# Patient Record
Sex: Female | Born: 1955
Health system: Southern US, Community
[De-identification: ages and names within clinical notes are randomized; demographics above are authoritative.]

## PROBLEM LIST (undated history)

## (undated) DIAGNOSIS — Z1239 Encounter for other screening for malignant neoplasm of breast: Secondary | ICD-10-CM

## (undated) DIAGNOSIS — N63 Unspecified lump in unspecified breast: Secondary | ICD-10-CM

## (undated) DIAGNOSIS — Z860100 Personal history of colon polyps, unspecified: Secondary | ICD-10-CM

## (undated) DIAGNOSIS — Z1211 Encounter for screening for malignant neoplasm of colon: Secondary | ICD-10-CM

## (undated) DIAGNOSIS — L309 Dermatitis, unspecified: Secondary | ICD-10-CM

## (undated) DIAGNOSIS — D4860 Neoplasm of uncertain behavior of unspecified breast: Secondary | ICD-10-CM

## (undated) DIAGNOSIS — Z8371 Family history of colonic polyps: Secondary | ICD-10-CM

## (undated) DIAGNOSIS — Z83719 Family history of colon polyps, unspecified: Secondary | ICD-10-CM

## (undated) DIAGNOSIS — I10 Essential (primary) hypertension: Secondary | ICD-10-CM

## (undated) DIAGNOSIS — D649 Anemia, unspecified: Secondary | ICD-10-CM

## (undated) HISTORY — DX: Family history of colon polyps, unspecified: Z83.719

## (undated) HISTORY — PX: TOTAL ABDOMINAL HYSTERECTOMY W/ BILATERAL SALPINGOOPHORECTOMY: SHX83

## (undated) HISTORY — DX: Unspecified lump in unspecified breast: N63.0

## (undated) HISTORY — DX: Personal history of colonic polyps: Z86.010

## (undated) HISTORY — DX: Neoplasm of uncertain behavior of unspecified breast: D48.60

## (undated) HISTORY — DX: Encounter for screening for malignant neoplasm of colon: Z12.11

## (undated) HISTORY — PX: BREAST BIOPSY: SHX20

## (undated) HISTORY — DX: Encounter for other screening for malignant neoplasm of breast: Z12.39

## (undated) HISTORY — DX: Dermatitis, unspecified: L30.9

## (undated) HISTORY — PX: ABDOMINAL HYSTERECTOMY: SHX81

## (undated) HISTORY — DX: Essential (primary) hypertension: I10

## (undated) HISTORY — DX: Anemia, unspecified: D64.9

## (undated) HISTORY — DX: Personal history of colon polyps, unspecified: Z86.0100

## (undated) HISTORY — DX: Family history of colonic polyps: Z83.71

## (undated) HISTORY — PX: COLONOSCOPY: SHX174

---

## 2004-10-05 ENCOUNTER — Ambulatory Visit: Payer: Self-pay | Admitting: Family Medicine

## 2007-11-10 ENCOUNTER — Ambulatory Visit: Payer: Self-pay | Admitting: General Surgery

## 2008-08-30 ENCOUNTER — Inpatient Hospital Stay: Payer: Self-pay | Admitting: Internal Medicine

## 2009-12-01 ENCOUNTER — Ambulatory Visit: Payer: Self-pay | Admitting: Internal Medicine

## 2011-03-09 ENCOUNTER — Ambulatory Visit: Payer: Self-pay | Admitting: General Surgery

## 2011-03-12 LAB — PATHOLOGY REPORT

## 2012-05-01 HISTORY — PX: BREAST CYST ASPIRATION: SHX578

## 2012-07-21 HISTORY — PX: BREAST BIOPSY: SHX20

## 2012-11-29 ENCOUNTER — Encounter: Payer: Self-pay | Admitting: General Surgery

## 2013-01-20 ENCOUNTER — Ambulatory Visit: Payer: Self-pay | Admitting: General Surgery

## 2013-01-28 ENCOUNTER — Encounter: Payer: Self-pay | Admitting: *Deleted

## 2013-07-01 ENCOUNTER — Ambulatory Visit: Payer: Self-pay | Admitting: Emergency Medicine

## 2013-07-01 LAB — URINALYSIS, COMPLETE
Blood: NEGATIVE
Ketone: NEGATIVE
RBC,UR: NONE SEEN /HPF (ref 0–5)
Specific Gravity: 1.01 (ref 1.003–1.030)

## 2013-10-01 DIAGNOSIS — K863 Pseudocyst of pancreas: Secondary | ICD-10-CM | POA: Insufficient documentation

## 2014-02-12 DIAGNOSIS — K219 Gastro-esophageal reflux disease without esophagitis: Secondary | ICD-10-CM | POA: Insufficient documentation

## 2014-02-12 DIAGNOSIS — I10 Essential (primary) hypertension: Secondary | ICD-10-CM | POA: Insufficient documentation

## 2014-02-14 DIAGNOSIS — E559 Vitamin D deficiency, unspecified: Secondary | ICD-10-CM | POA: Insufficient documentation

## 2014-02-14 DIAGNOSIS — D649 Anemia, unspecified: Secondary | ICD-10-CM | POA: Insufficient documentation

## 2014-02-17 ENCOUNTER — Ambulatory Visit: Payer: Self-pay | Admitting: Family Medicine

## 2014-08-02 ENCOUNTER — Encounter: Payer: Self-pay | Admitting: General Surgery

## 2014-12-15 DIAGNOSIS — M5412 Radiculopathy, cervical region: Secondary | ICD-10-CM | POA: Insufficient documentation

## 2015-12-22 ENCOUNTER — Encounter: Payer: Self-pay | Admitting: *Deleted

## 2016-02-20 ENCOUNTER — Encounter: Payer: Self-pay | Admitting: *Deleted

## 2016-03-07 ENCOUNTER — Ambulatory Visit: Payer: Self-pay | Admitting: General Surgery

## 2016-04-15 DIAGNOSIS — R9431 Abnormal electrocardiogram [ECG] [EKG]: Secondary | ICD-10-CM | POA: Diagnosis not present

## 2016-04-15 DIAGNOSIS — I517 Cardiomegaly: Secondary | ICD-10-CM | POA: Diagnosis not present

## 2016-04-15 DIAGNOSIS — R109 Unspecified abdominal pain: Secondary | ICD-10-CM | POA: Diagnosis not present

## 2016-04-15 DIAGNOSIS — R51 Headache: Secondary | ICD-10-CM | POA: Diagnosis not present

## 2016-04-15 DIAGNOSIS — G8929 Other chronic pain: Secondary | ICD-10-CM | POA: Diagnosis not present

## 2016-04-15 DIAGNOSIS — R8271 Bacteriuria: Secondary | ICD-10-CM | POA: Diagnosis not present

## 2016-04-15 DIAGNOSIS — I10 Essential (primary) hypertension: Secondary | ICD-10-CM | POA: Diagnosis not present

## 2016-04-15 DIAGNOSIS — R1084 Generalized abdominal pain: Secondary | ICD-10-CM | POA: Diagnosis not present

## 2016-04-20 DIAGNOSIS — D500 Iron deficiency anemia secondary to blood loss (chronic): Secondary | ICD-10-CM | POA: Diagnosis not present

## 2016-04-20 DIAGNOSIS — I10 Essential (primary) hypertension: Secondary | ICD-10-CM | POA: Diagnosis not present

## 2016-05-03 DIAGNOSIS — I10 Essential (primary) hypertension: Secondary | ICD-10-CM | POA: Diagnosis not present

## 2016-05-03 DIAGNOSIS — D500 Iron deficiency anemia secondary to blood loss (chronic): Secondary | ICD-10-CM | POA: Diagnosis not present

## 2016-06-27 ENCOUNTER — Encounter: Payer: Self-pay | Admitting: *Deleted

## 2016-08-09 DIAGNOSIS — Z1211 Encounter for screening for malignant neoplasm of colon: Secondary | ICD-10-CM | POA: Diagnosis not present

## 2016-08-09 DIAGNOSIS — D500 Iron deficiency anemia secondary to blood loss (chronic): Secondary | ICD-10-CM | POA: Diagnosis not present

## 2016-08-09 DIAGNOSIS — J040 Acute laryngitis: Secondary | ICD-10-CM | POA: Diagnosis not present

## 2016-08-09 DIAGNOSIS — J069 Acute upper respiratory infection, unspecified: Secondary | ICD-10-CM | POA: Diagnosis not present

## 2016-08-21 ENCOUNTER — Encounter: Payer: Self-pay | Admitting: *Deleted

## 2016-08-27 ENCOUNTER — Ambulatory Visit: Payer: Self-pay | Admitting: General Surgery

## 2016-10-05 ENCOUNTER — Encounter: Payer: Self-pay | Admitting: General Surgery

## 2016-10-07 DIAGNOSIS — J069 Acute upper respiratory infection, unspecified: Secondary | ICD-10-CM | POA: Diagnosis not present

## 2016-10-07 DIAGNOSIS — J029 Acute pharyngitis, unspecified: Secondary | ICD-10-CM | POA: Diagnosis not present

## 2016-10-07 DIAGNOSIS — B9789 Other viral agents as the cause of diseases classified elsewhere: Secondary | ICD-10-CM | POA: Diagnosis not present

## 2016-10-24 DIAGNOSIS — R0789 Other chest pain: Secondary | ICD-10-CM | POA: Diagnosis not present

## 2016-11-14 DIAGNOSIS — H5203 Hypermetropia, bilateral: Secondary | ICD-10-CM | POA: Diagnosis not present

## 2017-04-08 DIAGNOSIS — Z800 Family history of malignant neoplasm of digestive organs: Secondary | ICD-10-CM | POA: Insufficient documentation

## 2017-04-08 DIAGNOSIS — Z86010 Personal history of colonic polyps: Secondary | ICD-10-CM | POA: Insufficient documentation

## 2017-04-09 DIAGNOSIS — K219 Gastro-esophageal reflux disease without esophagitis: Secondary | ICD-10-CM | POA: Diagnosis not present

## 2017-04-09 DIAGNOSIS — Z1211 Encounter for screening for malignant neoplasm of colon: Secondary | ICD-10-CM | POA: Diagnosis not present

## 2017-04-09 DIAGNOSIS — Z86010 Personal history of colonic polyps: Secondary | ICD-10-CM | POA: Diagnosis not present

## 2017-04-09 DIAGNOSIS — K644 Residual hemorrhoidal skin tags: Secondary | ICD-10-CM | POA: Diagnosis not present

## 2017-04-09 DIAGNOSIS — K6389 Other specified diseases of intestine: Secondary | ICD-10-CM | POA: Diagnosis not present

## 2017-04-09 DIAGNOSIS — I10 Essential (primary) hypertension: Secondary | ICD-10-CM | POA: Diagnosis not present

## 2017-04-09 DIAGNOSIS — D120 Benign neoplasm of cecum: Secondary | ICD-10-CM | POA: Diagnosis not present

## 2017-04-09 DIAGNOSIS — Z800 Family history of malignant neoplasm of digestive organs: Secondary | ICD-10-CM | POA: Diagnosis not present

## 2017-04-09 DIAGNOSIS — D500 Iron deficiency anemia secondary to blood loss (chronic): Secondary | ICD-10-CM | POA: Diagnosis not present

## 2017-04-09 DIAGNOSIS — K859 Acute pancreatitis without necrosis or infection, unspecified: Secondary | ICD-10-CM | POA: Diagnosis not present

## 2017-04-09 DIAGNOSIS — D649 Anemia, unspecified: Secondary | ICD-10-CM | POA: Diagnosis not present

## 2017-04-09 DIAGNOSIS — K5730 Diverticulosis of large intestine without perforation or abscess without bleeding: Secondary | ICD-10-CM | POA: Diagnosis not present

## 2017-09-03 DIAGNOSIS — D500 Iron deficiency anemia secondary to blood loss (chronic): Secondary | ICD-10-CM | POA: Diagnosis not present

## 2017-09-03 DIAGNOSIS — M7061 Trochanteric bursitis, right hip: Secondary | ICD-10-CM | POA: Diagnosis not present

## 2017-09-03 DIAGNOSIS — M940 Chondrocostal junction syndrome [Tietze]: Secondary | ICD-10-CM | POA: Diagnosis not present

## 2017-09-03 DIAGNOSIS — R7989 Other specified abnormal findings of blood chemistry: Secondary | ICD-10-CM | POA: Diagnosis not present

## 2017-09-03 DIAGNOSIS — I10 Essential (primary) hypertension: Secondary | ICD-10-CM | POA: Diagnosis not present

## 2017-09-04 ENCOUNTER — Encounter: Payer: Self-pay | Admitting: Emergency Medicine

## 2017-09-04 ENCOUNTER — Emergency Department
Admission: EM | Admit: 2017-09-04 | Discharge: 2017-09-04 | Disposition: A | Discharge status: Home / Self Care | Payer: BLUE CROSS/BLUE SHIELD | Attending: Emergency Medicine | Admitting: Emergency Medicine

## 2017-09-04 ENCOUNTER — Other Ambulatory Visit: Payer: Self-pay

## 2017-09-04 ENCOUNTER — Emergency Department: Payer: BLUE CROSS/BLUE SHIELD

## 2017-09-04 DIAGNOSIS — J069 Acute upper respiratory infection, unspecified: Secondary | ICD-10-CM | POA: Diagnosis not present

## 2017-09-04 DIAGNOSIS — I10 Essential (primary) hypertension: Secondary | ICD-10-CM | POA: Insufficient documentation

## 2017-09-04 DIAGNOSIS — M25511 Pain in right shoulder: Secondary | ICD-10-CM | POA: Diagnosis not present

## 2017-09-04 DIAGNOSIS — R079 Chest pain, unspecified: Secondary | ICD-10-CM | POA: Diagnosis not present

## 2017-09-04 DIAGNOSIS — R05 Cough: Secondary | ICD-10-CM | POA: Diagnosis not present

## 2017-09-04 DIAGNOSIS — S299XXA Unspecified injury of thorax, initial encounter: Secondary | ICD-10-CM | POA: Diagnosis not present

## 2017-09-04 DIAGNOSIS — Z79899 Other long term (current) drug therapy: Secondary | ICD-10-CM | POA: Insufficient documentation

## 2017-09-04 MED ORDER — AZITHROMYCIN 250 MG PO TABS: ORAL_TABLET | ORAL | 0 refills | Status: DC

## 2017-09-04 NOTE — ED Triage Notes (Signed)
Says sick since around thanksgiving with cough, body aches.  Says she fell 2 days ago walking on wet pavement.  inj right sholder and under right breast.

## 2017-09-04 NOTE — Discharge Instructions (Signed)
Follow up with your regular doctor if you are not better in 3-5 days, take the medication as prescribed, he can also use over-the-counter Mucinex or Delsym, if you become short of breath or feel that you're becoming worse please return to the emergency department

## 2017-09-04 NOTE — ED Provider Notes (Signed)
[facility] Emergency Department Provider Note  ____________________________________________   First MD Initiated Contact with Patient 09/04/17 1059     (approximate)  I have reviewed the triage vital signs and the nursing notes.   HISTORY  Chief Complaint Fall and Cough    HPI Laurie Gross is a 61 y.o. female complains of cough and congestion since before Thanksgiving, she denies fever or chills, states mucus is yellow and green, she also fell 2 days ago and is having pain in the right shoulder and under her breast,not as chest pain or shortness of breath, denies abdominal pain   Past Medical History:  Diagnosis Date  . Anemia   . Breast screening, unspecified   . Family history of colonic polyps   . Lump or mass in breast   . Neoplasm of uncertain behavior of breast   . Personal history of colonic polyps   . Special screening for malignant neoplasms, colon   . Unspecified essential hypertension     There are no active problems to display for this patient.   Past Surgical History:  Procedure Laterality Date  . BREAST BIOPSY Right Age 79  . BREAST BIOPSY Left 07-21-12   Core biopsy showed atypical lobular hyperplasia and a 2 mm foci, atypical ductal hyperplasia and a 1 mm foci. There was no evidence of in situ or invasive cancer  . BREAST CYST ASPIRATION  August 2013   inconclusive, with atypical ductal cells identified  . COLONOSCOPY  2002, 2005, 2009, 2012   polyps   . TOTAL ABDOMINAL HYSTERECTOMY W/ BILATERAL SALPINGOOPHORECTOMY  Age 73    Prior to Admission medications   Medication Sig Start Date End Date Taking? Authorizing Provider  azithromycin (ZITHROMAX Z-PAK) 250 MG tablet 2 pills today then 1 pill a day for 4 days 09/04/17   Versie Starks, PA  carvedilol (COREG) 6.25 MG tablet Take 6.25 mg by mouth daily.    [provider]  lisinopril-hydrochlorothiazide (PRINZIDE,ZESTORETIC) 10-12.5 MG per tablet Take 1 tablet by  mouth daily.    [provider]  tamoxifen (NOLVADEX) 20 MG tablet Take 20 mg by mouth daily.    [provider]    Allergies Penicillins  Family History  Problem Relation Age of Onset  . Colon cancer Father   . Colon cancer Other        Paternal first cousin    Social History Social History   Tobacco Use  . Smoking status: Never Smoker  . Smokeless tobacco: Never Used  Substance Use Topics  . Alcohol use: Yes    Comment: occassionally  . Drug use: No    Review of Systems  Constitutional: No fever/chills Eyes: No visual changes. ENT: No sore throat. Positive runny nose and congestion Respiratory: Positive cough Genitourinary: Negative for dysuria. Musculoskeletal: Negative for back pain. Positive for right rib pain, positive right shoulder pain Skin: Negative for rash.    ____________________________________________   PHYSICAL EXAM:  VITAL SIGNS: ED Triage Vitals  Enc Vitals Group     BP 09/04/17 1010 (!) 181/87     Pulse Rate 09/04/17 1010 86     Resp 09/04/17 1010 16     Temp 09/04/17 1010 98.4 F (36.9 C)     Temp Source 09/04/17 1010 Oral     SpO2 09/04/17 1010 100 %     Weight 09/04/17 1011 158 lb (71.7 kg)     Height 09/04/17 1011 5\' 7"  (1.702 m)  Head Circumference --      Peak Flow --      Pain Score 09/04/17 1010 5     Pain Loc --      Pain Edu? --      Excl. in Reserve? --     Constitutional: Alert and oriented. Well appearing and in no acute distress. Eyes: Conjunctivae are normal.  Head: Atraumatic. Nose: No congestion/rhinnorhea. Mouth/Throat: Mucous membranes are moist.  No redness in the throat Cardiovascular: Normal rate, regular rhythm. Heart sounds are normal Respiratory: Normal respiratory effort.  No retractions, lungs are clear to auscultation, cough is dry and hacking GU: deferred Musculoskeletal: FROM all extremities, warm and well perfused, right ribs are tender no fractures palpated Neurologic:  Normal  speech and language.  Skin:  Skin is warm, dry and intact. No rash noted. Psychiatric: Mood and affect are normal. Speech and behavior are normal.  ____________________________________________   LABS (all labs ordered are listed, but only abnormal results are displayed)  Labs Reviewed - No data to display ____________________________________________   ____________________________________________  RADIOLOGY  Chest x-ray is normal  ____________________________________________   PROCEDURES  Procedure(s) performed: No      ____________________________________________   INITIAL IMPRESSION / ASSESSMENT AND PLAN / ED COURSE  Pertinent labs & imaging results that were available during my care of the patient were reviewed by me and considered in my medical decision making (see chart for details).  Patient is  a 61 year old female with cough and congestion for about a month, prescription for a Z-Pak was given, chest x-ray was negative, patient appears well and in no distress. Is discharged in stable condition, she was instructed to go to her family doctor if she is worsening or return to the emergency department    ____________________________________________   FINAL CLINICAL IMPRESSION(S) / ED DIAGNOSES  Final diagnoses:  Upper respiratory tract infection, unspecified type      NEW MEDICATIONS STARTED DURING THIS VISIT:  This SmartLink is deprecated. Use AVSMEDLIST instead to display the medication list for a patient.   Note:  This document was prepared using Dragon voice recognition software and may include unintentional dictation errors.    Versie Starks, PA 09/04/17 1423    Nance Pear, MD 09/04/17 [phone]

## 2017-09-12 DIAGNOSIS — E782 Mixed hyperlipidemia: Secondary | ICD-10-CM | POA: Diagnosis not present

## 2017-09-12 DIAGNOSIS — I10 Essential (primary) hypertension: Secondary | ICD-10-CM | POA: Diagnosis not present

## 2017-09-12 DIAGNOSIS — M940 Chondrocostal junction syndrome [Tietze]: Secondary | ICD-10-CM | POA: Diagnosis not present

## 2017-09-12 DIAGNOSIS — Z23 Encounter for immunization: Secondary | ICD-10-CM | POA: Diagnosis not present

## 2017-09-12 DIAGNOSIS — R7989 Other specified abnormal findings of blood chemistry: Secondary | ICD-10-CM | POA: Diagnosis not present

## 2017-09-12 DIAGNOSIS — E611 Iron deficiency: Secondary | ICD-10-CM | POA: Diagnosis not present

## 2017-09-13 DIAGNOSIS — R7989 Other specified abnormal findings of blood chemistry: Secondary | ICD-10-CM | POA: Diagnosis not present

## 2017-09-13 DIAGNOSIS — E782 Mixed hyperlipidemia: Secondary | ICD-10-CM | POA: Diagnosis not present

## 2017-09-13 DIAGNOSIS — I10 Essential (primary) hypertension: Secondary | ICD-10-CM | POA: Diagnosis not present

## 2017-09-13 DIAGNOSIS — E611 Iron deficiency: Secondary | ICD-10-CM | POA: Diagnosis not present

## 2017-10-03 ENCOUNTER — Inpatient Hospital Stay: Payer: BLUE CROSS/BLUE SHIELD | Admitting: Oncology

## 2017-11-06 ENCOUNTER — Encounter: Payer: Self-pay | Admitting: Internal Medicine

## 2017-11-06 ENCOUNTER — Ambulatory Visit: Payer: BLUE CROSS/BLUE SHIELD | Admitting: Internal Medicine

## 2017-11-06 VITALS — BP 136/61 | HR 94 | Resp 16 | Ht 67.0 in | Wt 155.8 lb

## 2017-11-06 DIAGNOSIS — E782 Mixed hyperlipidemia: Secondary | ICD-10-CM

## 2017-11-06 DIAGNOSIS — R300 Dysuria: Secondary | ICD-10-CM

## 2017-11-06 DIAGNOSIS — Z0001 Encounter for general adult medical examination with abnormal findings: Secondary | ICD-10-CM | POA: Diagnosis not present

## 2017-11-06 DIAGNOSIS — K2100 Gastro-esophageal reflux disease with esophagitis, without bleeding: Secondary | ICD-10-CM

## 2017-11-06 DIAGNOSIS — I10 Essential (primary) hypertension: Secondary | ICD-10-CM

## 2017-11-06 DIAGNOSIS — D508 Other iron deficiency anemias: Secondary | ICD-10-CM

## 2017-11-06 MED ORDER — LISINOPRIL 40 MG PO TABS: 40.0000 mg | ORAL_TABLET | Freq: Every day | ORAL | 11 refills | Status: DC

## 2017-11-06 MED ORDER — NIACIN ER (ANTIHYPERLIPIDEMIC) 500 MG PO TBCR: 500.0000 mg | EXTENDED_RELEASE_TABLET | Freq: Every day | ORAL | 11 refills | Status: DC

## 2017-11-06 MED ORDER — AMLODIPINE BESYLATE 10 MG PO TABS: 10.0000 mg | ORAL_TABLET | Freq: Every day | ORAL | 10 refills | Status: DC

## 2017-11-06 MED ORDER — PANTOPRAZOLE SODIUM 40 MG PO TBEC: 40.0000 mg | DELAYED_RELEASE_TABLET | Freq: Every day | ORAL | 12 refills | Status: DC

## 2017-11-06 NOTE — Progress Notes (Signed)
[facility] [place], [place] [address]  Internal MEDICINE  Office Visit Note  Patient Name: Laurie Gross  [id number]  [id number]  Date of Service: 11/26/2017  Chief Complaint  Patient presents with  . Annual Exam  . Anemia     HPI Pt is here for routine health maintenance examination. She has been feeling well. She has been taking iron for anemia but does feel tired at times. Had Colonoscopy with polyps Takes all medications as prescribed at this time   Current Medication: Outpatient Encounter Medications as of 11/06/2017  Medication Sig  . Cholecalciferol (VITAMIN D-3) 5000 units TABS Take 5,000 Units by mouth.  . docusate sodium (COLACE) 100 MG capsule Take 100 mg by mouth 2 (two) times daily.  Marland Kitchen lisinopril (PRINIVIL,ZESTRIL) 40 MG tablet Take 1 tablet (40 mg total) by mouth daily.  . niacin (NIASPAN) 500 MG CR tablet Take 1 tablet (500 mg total) by mouth at bedtime.  . Saccharomyces boulardii (FLORASTOR PO) Take by mouth.  . [DISCONTINUED] lisinopril (PRINIVIL,ZESTRIL) 40 MG tablet Take by mouth.  . [DISCONTINUED] niacin (NIASPAN) 500 MG CR tablet Take by mouth.  Marland Kitchen amLODipine (NORVASC) 10 MG tablet Take 1 tablet (10 mg total) by mouth daily.  . pantoprazole (PROTONIX) 40 MG tablet Take 1 tablet (40 mg total) by mouth daily.  . [DISCONTINUED] amLODipine (NORVASC) 10 MG tablet   . [DISCONTINUED] azithromycin (ZITHROMAX Z-PAK) 250 MG tablet 2 pills today then 1 pill a day for 4 days  . [DISCONTINUED] carvedilol (COREG) 6.25 MG tablet Take 6.25 mg by mouth daily.  . [DISCONTINUED] lisinopril-hydrochlorothiazide (PRINZIDE,ZESTORETIC) 10-12.5 MG per tablet Take 1 tablet by mouth daily.  . [DISCONTINUED] pantoprazole (PROTONIX) 40 MG tablet TK 1 T PO BID  . [DISCONTINUED] tamoxifen (NOLVADEX) 20 MG tablet Take 20 mg by mouth daily.   No facility-administered encounter medications on file as of 11/06/2017.     Surgical History: Past Surgical History:   Procedure Laterality Date  . BREAST BIOPSY Right Age 66  . BREAST BIOPSY Left 07-21-12   Core biopsy showed atypical lobular hyperplasia and a 2 mm foci, atypical ductal hyperplasia and a 1 mm foci. There was no evidence of in situ or invasive cancer  . BREAST CYST ASPIRATION  August 2013   inconclusive, with atypical ductal cells identified  . COLONOSCOPY  2002, 2005, 2009, 2012   polyps   . TOTAL ABDOMINAL HYSTERECTOMY W/ BILATERAL SALPINGOOPHORECTOMY  Age 27    Medical History: Past Medical History:  Diagnosis Date  . Anemia   . Breast screening, unspecified   . Family history of colonic polyps   . Lump or mass in breast   . Neoplasm of uncertain behavior of breast   . Personal history of colonic polyps   . Special screening for malignant neoplasms, colon   . Unspecified essential hypertension     Family History: Family History  Problem Relation Age of Onset  . Colon cancer Father   . Colon cancer Other        Paternal first cousin      Review of Systems  Constitutional: Negative for activity change, appetite change, chills, diaphoresis, fatigue, fever and unexpected weight change.  HENT: Negative for congestion, ear discharge, ear pain, facial swelling, hearing loss, nosebleeds, postnasal drip, rhinorrhea, sinus pressure, sinus pain, sneezing, sore throat, tinnitus, trouble swallowing and voice change.   Eyes: Negative for photophobia, pain, discharge, redness, itching and visual disturbance.  Respiratory: Negative for apnea, cough, choking,  chest tightness, shortness of breath, wheezing and stridor.   Cardiovascular: Positive for palpitations. Negative for chest pain and leg swelling.  Gastrointestinal: Negative for abdominal distention, abdominal pain, anal bleeding, constipation, diarrhea, nausea, rectal pain and vomiting.  Endocrine: Negative for cold intolerance, heat intolerance, polydipsia, polyphagia and polyuria.  Genitourinary: Negative for difficulty  urinating, dysuria, flank pain, frequency, genital sores, hematuria, menstrual problem, pelvic pain, urgency, vaginal bleeding, vaginal discharge and vaginal pain.  Musculoskeletal: Negative for arthralgias, back pain, gait problem, joint swelling, myalgias and neck pain.  Skin: Negative for color change, pallor, rash and wound.  Allergic/Immunologic: Negative for environmental allergies, food allergies and immunocompromised state.  Neurological: Negative for dizziness, seizures, syncope, facial asymmetry, speech difficulty, weakness, light-headedness, numbness and headaches.  Hematological: Negative for adenopathy. Does not bruise/bleed easily.  Psychiatric/Behavioral: Negative for agitation, behavioral problems, confusion, decreased concentration, dysphoric mood, hallucinations, self-injury, sleep disturbance and suicidal ideas. The patient is not nervous/anxious and is not hyperactive.      Vital Signs: BP 136/61 (BP Location: Left Arm, Patient Position: Sitting)   Pulse 94   Resp 16   Ht 5\' 7"  (1.702 m)   Wt 155 lb 12.8 oz (70.7 kg)   SpO2 100%   BMI 24.40 kg/m    Physical Exam  Constitutional: She appears well-developed and well-nourished. No distress.  HENT:  Head: Normocephalic and atraumatic.  Right Ear: External ear normal.  Left Ear: External ear normal.  Nose: Nose normal.  Mouth/Throat: Oropharynx is clear and moist. No oropharyngeal exudate.  Eyes: Conjunctivae and EOM are normal. Pupils are equal, round, and reactive to light. Right eye exhibits no discharge. Left eye exhibits no discharge. No scleral icterus.  Neck: Normal range of motion. Neck supple. No JVD present. No tracheal deviation present. No thyromegaly present.  Cardiovascular: Normal rate, regular rhythm, normal heart sounds and intact distal pulses. Exam reveals no gallop and no friction rub.  No murmur heard. Pulmonary/Chest: Effort normal and breath sounds normal. No stridor. No respiratory distress. She  has no wheezes. She has no rales. She exhibits no tenderness. Right breast exhibits no mass. Left breast exhibits no mass. Breasts are symmetrical.  Abdominal: Soft. Bowel sounds are normal. She exhibits no distension and no mass. There is no tenderness. There is no rebound and no guarding.  Genitourinary: Vagina normal and uterus normal. No vaginal discharge found.  Musculoskeletal: Normal range of motion. She exhibits no edema, tenderness or deformity.  Lymphadenopathy:    She has no cervical adenopathy.  Neurological: She is alert. She has normal reflexes. No cranial nerve deficit. She exhibits normal muscle tone. Coordination normal.  Skin: Skin is warm and dry. No rash noted. She is not diaphoretic. No erythema. No pallor.  Psychiatric: She has a normal mood and affect. Her behavior is normal. Judgment and thought content normal.     LABS: Recent Results (from the past 2160 hour(s))  Urinalysis, Routine w reflex microscopic     Status: None   Collection Time: 11/06/17 11:42 AM  Result Value Ref Range   Specific Gravity, UA 1.016 1.005 - 1.030   pH, UA 5.5 5.0 - 7.5   Color, UA Yellow Yellow   Appearance Ur Clear Clear   Leukocytes, UA Negative Negative   Protein, UA Negative Negative/Trace   Glucose, UA Negative Negative   Ketones, UA Negative Negative   RBC, UA Negative Negative   Bilirubin, UA Negative Negative   Urobilinogen, Ur 0.2 0.2 - 1.0 mg/dL   Nitrite, UA Negative  Negative   Microscopic Examination Comment     Comment: Microscopic not indicated and not performed.  CBC with Differential/Platelet     Status: Abnormal   Collection Time: 11/06/17  1:33 PM  Result Value Ref Range   WBC 4.2 3.4 - 10.8 x10E3/uL   RBC 3.35 (L) 3.77 - 5.28 x10E6/uL   Hemoglobin 8.9 (L) 11.1 - 15.9 g/dL   Hematocrit 28.2 (L) 34.0 - 46.6 %   MCV 84 79 - 97 fL   MCH 26.6 26.6 - 33.0 pg   MCHC 31.6 31.5 - 35.7 g/dL   RDW 17.8 (H) 12.3 - 15.4 %   Platelets 314 150 - 379 x10E3/uL    Neutrophils 58 Not Estab. %   Lymphs 30 Not Estab. %   Monocytes 8 Not Estab. %   Eos 3 Not Estab. %   Basos 1 Not Estab. %   Neutrophils Absolute 2.4 1.4 - 7.0 x10E3/uL   Lymphocytes Absolute 1.3 0.7 - 3.1 x10E3/uL   Monocytes Absolute 0.4 0.1 - 0.9 x10E3/uL   EOS (ABSOLUTE) 0.1 0.0 - 0.4 x10E3/uL   Basophils Absolute 0.0 0.0 - 0.2 x10E3/uL   Immature Granulocytes 0 Not Estab. %   Immature Grans (Abs) 0.0 0.0 - 0.1 x10E3/uL  Lipid Panel With LDL/HDL Ratio     Status: Abnormal   Collection Time: 11/06/17  1:33 PM  Result Value Ref Range   Cholesterol, Total 228 (H) 100 - 199 mg/dL   Triglycerides 284 (H) 0 - 149 mg/dL   HDL 79 >39 mg/dL   VLDL Cholesterol Cal 57 (H) 5 - 40 mg/dL   LDL Calculated 92 0 - 99 mg/dL   LDl/HDL Ratio 1.2 0.0 - 3.2 ratio    Comment:                                     LDL/HDL Ratio                                             Men  Women                               1/2 Avg.Risk  1.0    1.5                                   Avg.Risk  3.6    3.2                                2X Avg.Risk  6.2    5.0                                3X Avg.Risk  8.0    6.1   TSH     Status: None   Collection Time: 11/06/17  1:33 PM  Result Value Ref Range   TSH 0.707 0.450 - 4.500 uIU/mL  T4, free     Status: None   Collection Time: 11/06/17  1:33 PM  Result Value Ref Range   Free T4 1.24 0.82 - 1.77  ng/dL  Comprehensive metabolic panel     Status: Abnormal   Collection Time: 11/06/17  1:33 PM  Result Value Ref Range   Glucose 93 65 - 99 mg/dL   BUN 9 8 - 27 mg/dL   Creatinine, Ser 0.53 (L) 0.57 - 1.00 mg/dL   GFR calc non Af Amer 103 >59 mL/min/1.73   GFR calc Af Amer 119 >59 mL/min/1.73   BUN/Creatinine Ratio 17 12 - 28   Sodium 144 134 - 144 mmol/L   Potassium 3.6 3.5 - 5.2 mmol/L   Chloride 101 96 - 106 mmol/L   CO2 26 20 - 29 mmol/L   Calcium 9.6 8.7 - 10.3 mg/dL   Total Protein 7.2 6.0 - 8.5 g/dL   Albumin 4.6 3.6 - 4.8 g/dL   Globulin, Total 2.6 1.5 -  4.5 g/dL   Albumin/Globulin Ratio 1.8 1.2 - 2.2   Bilirubin Total 0.7 0.0 - 1.2 mg/dL   Alkaline Phosphatase 46 39 - 117 IU/L   AST 21 0 - 40 IU/L   ALT 11 0 - 32 IU/L  Fe+TIBC+Fer     Status: Abnormal   Collection Time: 11/06/17  1:33 PM  Result Value Ref Range   Total Iron Binding Capacity 268 250 - 450 ug/dL   UIBC 173 118 - 369 ug/dL   Iron 95 27 - 139 ug/dL   Iron Saturation 35 15 - 55 %   Ferritin 610 (H) 15 - 150 ng/mL   Assessment/Plan: 1. Encounter for general adult medical examination with abnormal findings - Update all medications  - TSH - T4, free - Comprehensive metabolic panel - MM DIGITAL SCREENING BILATERAL; Future  2. Iron deficiency anemia secondary to inadequate dietary iron intake - Continue as before - CBC with Differential/Platelet - Fe+TIBC+Fer  3. Mixed hyperlipidemia - niacin (NIASPAN) 500 MG CR tablet; Take 1 tablet (500 mg total) by mouth at bedtime.  Dispense: 30 tablet; Refill: 11 - Lipid Panel With LDL/HDL Ratio  4. Essential hypertension, benign  - lisinopril (PRINIVIL,ZESTRIL) 40 MG tablet; Take 1 tablet (40 mg total) by mouth daily.  Dispense: 30 tablet; Refill: 11 - amLODipine (NORVASC) 10 MG tablet; Take 1 tablet (10 mg total) by mouth daily.  Dispense: 30 tablet; Refill: 10 - Comprehensive metabolic panel  5. Dysuria - Urinalysis, Routine w reflex microscopic - Urinalysis  6. Gastroesophageal reflux disease with esophagitis - pantoprazole (PROTONIX) 40 MG tablet; Take 1 tablet (40 mg total) by mouth daily.  Dispense: 30 tablet; Refill: 12  General Counseling: Avayah verbalizes understanding of the findings of todays visit and agrees with plan of treatment. I have discussed any further diagnostic evaluation that may be needed or ordered today. We also reviewed her medications today. she has been encouraged to call the office with any questions or concerns that should arise related to todays visit.   Orders Placed This Encounter   Procedures  . MM DIGITAL SCREENING BILATERAL  . Urinalysis, Routine w reflex microscopic  . CBC with Differential/Platelet  . Lipid Panel With LDL/HDL Ratio  . TSH  . T4, free  . Comprehensive metabolic panel  . Urinalysis  . Fe+TIBC+Fer    Meds ordered this encounter  Medications  . pantoprazole (PROTONIX) 40 MG tablet    Sig: Take 1 tablet (40 mg total) by mouth daily.    Dispense:  30 tablet    Refill:  12  . niacin (NIASPAN) 500 MG CR tablet    Sig: Take 1 tablet (500 mg total)  by mouth at bedtime.    Dispense:  30 tablet    Refill:  11  . lisinopril (PRINIVIL,ZESTRIL) 40 MG tablet    Sig: Take 1 tablet (40 mg total) by mouth daily.    Dispense:  30 tablet    Refill:  11  . amLODipine (NORVASC) 10 MG tablet    Sig: Take 1 tablet (10 mg total) by mouth daily.    Dispense:  30 tablet    Refill:  10    Time spent:30 Benzie, MD  Internal Medicine

## 2017-11-07 LAB — CBC WITH DIFFERENTIAL/PLATELET
BASOS: 1 %
Basophils Absolute: 0 10*3/uL (ref 0.0–0.2)
EOS (ABSOLUTE): 0.1 10*3/uL (ref 0.0–0.4)
EOS: 3 %
HEMATOCRIT: 28.2 % — AB (ref 34.0–46.6)
HEMOGLOBIN: 8.9 g/dL — AB (ref 11.1–15.9)
IMMATURE GRANS (ABS): 0 10*3/uL (ref 0.0–0.1)
Immature Granulocytes: 0 %
LYMPHS: 30 %
Lymphocytes Absolute: 1.3 10*3/uL (ref 0.7–3.1)
MCH: 26.6 pg (ref 26.6–33.0)
MCHC: 31.6 g/dL (ref 31.5–35.7)
MCV: 84 fL (ref 79–97)
MONOCYTES: 8 %
Monocytes Absolute: 0.4 10*3/uL (ref 0.1–0.9)
NEUTROS ABS: 2.4 10*3/uL (ref 1.4–7.0)
Neutrophils: 58 %
Platelets: 314 10*3/uL (ref 150–379)
RBC: 3.35 x10E6/uL — ABNORMAL LOW (ref 3.77–5.28)
RDW: 17.8 % — ABNORMAL HIGH (ref 12.3–15.4)
WBC: 4.2 10*3/uL (ref 3.4–10.8)

## 2017-11-07 LAB — URINALYSIS, ROUTINE W REFLEX MICROSCOPIC
Bilirubin, UA: NEGATIVE
Glucose, UA: NEGATIVE
KETONES UA: NEGATIVE
LEUKOCYTES UA: NEGATIVE
Nitrite, UA: NEGATIVE
Protein, UA: NEGATIVE
RBC, UA: NEGATIVE
SPEC GRAV UA: 1.016 (ref 1.005–1.030)
Urobilinogen, Ur: 0.2 mg/dL (ref 0.2–1.0)
pH, UA: 5.5 (ref 5.0–7.5)

## 2017-11-07 LAB — COMPREHENSIVE METABOLIC PANEL
ALK PHOS: 46 IU/L (ref 39–117)
ALT: 11 IU/L (ref 0–32)
AST: 21 IU/L (ref 0–40)
Albumin/Globulin Ratio: 1.8 (ref 1.2–2.2)
Albumin: 4.6 g/dL (ref 3.6–4.8)
BUN/Creatinine Ratio: 17 (ref 12–28)
BUN: 9 mg/dL (ref 8–27)
Bilirubin Total: 0.7 mg/dL (ref 0.0–1.2)
CALCIUM: 9.6 mg/dL (ref 8.7–10.3)
CO2: 26 mmol/L (ref 20–29)
CREATININE: 0.53 mg/dL — AB (ref 0.57–1.00)
Chloride: 101 mmol/L (ref 96–106)
GFR calc Af Amer: 119 mL/min/{1.73_m2} (ref >59)
GFR, EST NON AFRICAN AMERICAN: 103 mL/min/{1.73_m2} (ref >59)
GLOBULIN, TOTAL: 2.6 g/dL (ref 1.5–4.5)
GLUCOSE: 93 mg/dL (ref 65–99)
Potassium: 3.6 mmol/L (ref 3.5–5.2)
SODIUM: 144 mmol/L (ref 134–144)
Total Protein: 7.2 g/dL (ref 6.0–8.5)

## 2017-11-07 LAB — LIPID PANEL WITH LDL/HDL RATIO
Cholesterol, Total: 228 mg/dL — ABNORMAL HIGH (ref 100–199)
HDL: 79 mg/dL (ref >39)
LDL Calculated: 92 mg/dL (ref 0–99)
LDL/HDL RATIO: 1.2 ratio (ref 0.0–3.2)
Triglycerides: 284 mg/dL — ABNORMAL HIGH (ref 0–149)
VLDL Cholesterol Cal: 57 mg/dL — ABNORMAL HIGH (ref 5–40)

## 2017-11-07 LAB — IRON,TIBC AND FERRITIN PANEL
FERRITIN: 610 ng/mL — AB (ref 15–150)
IRON: 95 ug/dL (ref 27–139)
Iron Saturation: 35 % (ref 15–55)
TIBC: 268 ug/dL (ref 250–450)
UIBC: 173 ug/dL (ref 118–369)

## 2017-11-07 LAB — T4, FREE: FREE T4: 1.24 ng/dL (ref 0.82–1.77)

## 2017-11-07 LAB — TSH: TSH: 0.707 u[IU]/mL (ref 0.450–4.500)

## 2017-11-26 ENCOUNTER — Ambulatory Visit: Payer: Self-pay | Admitting: Internal Medicine

## 2017-12-04 ENCOUNTER — Encounter: Payer: Self-pay | Admitting: Internal Medicine

## 2017-12-04 ENCOUNTER — Ambulatory Visit: Payer: BLUE CROSS/BLUE SHIELD | Admitting: Internal Medicine

## 2017-12-04 VITALS — BP 150/75 | HR 118 | Resp 16 | Ht 67.0 in | Wt 156.0 lb

## 2017-12-04 DIAGNOSIS — R7989 Other specified abnormal findings of blood chemistry: Secondary | ICD-10-CM | POA: Diagnosis not present

## 2017-12-04 DIAGNOSIS — D6489 Other specified anemias: Secondary | ICD-10-CM

## 2017-12-04 NOTE — Progress Notes (Signed)
[facility] [place], [place] [address]  Internal MEDICINE  Office Visit Note  Patient Name: Laurie Gross  [id number]  [id number]  Date of Service: 12/04/2017  Chief Complaint  Patient presents with  . Follow-up    labs     HPI  Pt is here for routine follow up on her Labs. She continues to have low hg. She was instructed to take iron per GI after her EGD and colonoscopy. Her ferritin is elevated. Denies any c/p sob or fatigue    Current Medication: Outpatient Encounter Medications as of 12/04/2017  Medication Sig  . amLODipine (NORVASC) 10 MG tablet Take 1 tablet (10 mg total) by mouth daily.  . Cholecalciferol (VITAMIN D-3) 5000 units TABS Take 5,000 Units by mouth.  . docusate sodium (COLACE) 100 MG capsule Take 100 mg by mouth 2 (two) times daily.  Marland Kitchen lisinopril (PRINIVIL,ZESTRIL) 40 MG tablet Take 1 tablet (40 mg total) by mouth daily.  . niacin (NIASPAN) 500 MG CR tablet Take 1 tablet (500 mg total) by mouth at bedtime.  . pantoprazole (PROTONIX) 40 MG tablet Take 1 tablet (40 mg total) by mouth daily.  . Saccharomyces boulardii (FLORASTOR PO) Take by mouth.   No facility-administered encounter medications on file as of 12/04/2017.     Surgical History: Past Surgical History:  Procedure Laterality Date  . BREAST BIOPSY Right Age 8  . BREAST BIOPSY Left 07-21-12   Core biopsy showed atypical lobular hyperplasia and a 2 mm foci, atypical ductal hyperplasia and a 1 mm foci. There was no evidence of in situ or invasive cancer  . BREAST CYST ASPIRATION  August 2013   inconclusive, with atypical ductal cells identified  . COLONOSCOPY  2002, 2005, 2009, 2012   polyps   . TOTAL ABDOMINAL HYSTERECTOMY W/ BILATERAL SALPINGOOPHORECTOMY  Age 5    Medical History: Past Medical History:  Diagnosis Date  . Anemia   . Breast screening, unspecified   . Family history of colonic polyps   . Lump or mass in breast   . Neoplasm of uncertain behavior of  breast   . Personal history of colonic polyps   . Special screening for malignant neoplasms, colon   . Unspecified essential hypertension     Family History: Family History  Problem Relation Age of Onset  . Colon cancer Father   . Colon cancer Other        Paternal first cousin    Social History   Socioeconomic History  . Marital status: Married    Spouse name: Not on file  . Number of children: Not on file  . Years of education: Not on file  . Highest education level: Not on file  Social Needs  . Financial resource strain: Not on file  . Food insecurity - worry: Not on file  . Food insecurity - inability: Not on file  . Transportation needs - medical: Not on file  . Transportation needs - non-medical: Not on file  Occupational History  . Not on file  Tobacco Use  . Smoking status: Never Smoker  . Smokeless tobacco: Never Used  Substance and Sexual Activity  . Alcohol use: Yes    Comment: occassionally  . Drug use: No  . Sexual activity: Not on file  Other Topics Concern  . Not on file  Social History Narrative  . Not on file      Review of Systems  Constitutional: Negative for chills, fatigue and unexpected weight change.  HENT: Positive for postnasal drip. Negative for congestion, rhinorrhea, sneezing and sore throat.   Eyes: Negative for redness.  Respiratory: Negative for cough, chest tightness and shortness of breath.   Cardiovascular: Negative for chest pain and palpitations.  Gastrointestinal: Negative for abdominal pain, constipation, diarrhea, nausea and vomiting.  Genitourinary: Negative for dysuria and frequency.  Musculoskeletal: Negative for arthralgias, back pain, joint swelling and neck pain.  Skin: Negative for rash.  Neurological: Negative.  Negative for tremors and numbness.  Hematological: Negative for adenopathy. Does not bruise/bleed easily.  Psychiatric/Behavioral: Negative for behavioral problems (Depression), sleep disturbance and  suicidal ideas. The patient is not nervous/anxious.     Vital Signs: BP (!) 150/75   Pulse (!) 118   Resp 16   Ht 5\' 7"  (1.702 m)   Wt 156 lb (70.8 kg)   SpO2 98%   BMI 24.43 kg/m    Physical Exam  Constitutional: She appears well-nourished.  HENT:  Head: Normocephalic and atraumatic.  Neck: Normal range of motion.  Cardiovascular: Normal rate and regular rhythm.  Abdominal: Soft.  Neurological: She is alert.  Psychiatric: She has a normal mood and affect. Her behavior is normal.   Assessment/Plan: 1. High serum ferritin - Ambulatory referral to Hematology  2. Anemia due to other cause, not classified - Ambulatory referral to Hematology  General Counseling: donalyn schneeberger understanding of the findings of todays visit and agrees with plan of treatment. I have discussed any further diagnostic evaluation that may be needed or ordered today. We also reviewed her medications today. she has been encouraged to call the office with any questions or concerns that should arise related to todays visit.   Orders Placed This Encounter  Procedures  . Ambulatory referral to Hematology   Time spent: 15Minutes  Dr Lavera Guise Internal medicine

## 2017-12-09 ENCOUNTER — Inpatient Hospital Stay: Payer: BLUE CROSS/BLUE SHIELD

## 2017-12-09 ENCOUNTER — Other Ambulatory Visit: Payer: Self-pay | Admitting: *Deleted

## 2017-12-09 ENCOUNTER — Encounter: Payer: Self-pay | Admitting: Oncology

## 2017-12-09 ENCOUNTER — Telehealth: Payer: Self-pay | Admitting: *Deleted

## 2017-12-09 ENCOUNTER — Inpatient Hospital Stay: Payer: BLUE CROSS/BLUE SHIELD | Attending: Oncology | Admitting: Oncology

## 2017-12-09 VITALS — BP 154/71 | HR 134 | Temp 98.8°F | Resp 22 | Ht 67.0 in | Wt 154.7 lb

## 2017-12-09 DIAGNOSIS — R7989 Other specified abnormal findings of blood chemistry: Secondary | ICD-10-CM | POA: Insufficient documentation

## 2017-12-09 DIAGNOSIS — E538 Deficiency of other specified B group vitamins: Secondary | ICD-10-CM | POA: Diagnosis not present

## 2017-12-09 DIAGNOSIS — R000 Tachycardia, unspecified: Secondary | ICD-10-CM | POA: Diagnosis not present

## 2017-12-09 DIAGNOSIS — D649 Anemia, unspecified: Secondary | ICD-10-CM | POA: Diagnosis not present

## 2017-12-09 LAB — CBC WITH DIFFERENTIAL/PLATELET
Basophils Absolute: 0 10*3/uL (ref 0–0.1)
Basophils Relative: 1 %
EOS ABS: 0 10*3/uL (ref 0–0.7)
Eosinophils Relative: 0 %
HCT: 30.7 % — ABNORMAL LOW (ref 35.0–47.0)
HEMOGLOBIN: 10.1 g/dL — AB (ref 12.0–16.0)
LYMPHS PCT: 22 %
Lymphs Abs: 1.2 10*3/uL (ref 1.0–3.6)
MCH: 27 pg (ref 26.0–34.0)
MCHC: 32.9 g/dL (ref 32.0–36.0)
MCV: 82.1 fL (ref 80.0–100.0)
Monocytes Absolute: 0.2 10*3/uL (ref 0.2–0.9)
Monocytes Relative: 4 %
NEUTROS PCT: 73 %
Neutro Abs: 4.1 10*3/uL (ref 1.4–6.5)
Platelets: 356 10*3/uL (ref 150–440)
RBC: 3.74 MIL/uL — AB (ref 3.80–5.20)
RDW: 15.2 % — ABNORMAL HIGH (ref 11.5–14.5)
WBC: 5.5 10*3/uL (ref 3.6–11.0)

## 2017-12-09 LAB — COMPREHENSIVE METABOLIC PANEL
ALT: 13 U/L — ABNORMAL LOW (ref 14–54)
AST: 30 U/L (ref 15–41)
Albumin: 5 g/dL (ref 3.5–5.0)
Alkaline Phosphatase: 45 U/L (ref 38–126)
Anion gap: 15 (ref 5–15)
BUN: 10 mg/dL (ref 6–20)
CHLORIDE: 101 mmol/L (ref 101–111)
CO2: 21 mmol/L — ABNORMAL LOW (ref 22–32)
Calcium: 9.2 mg/dL (ref 8.9–10.3)
Creatinine, Ser: 0.6 mg/dL (ref 0.44–1.00)
Glucose, Bld: 150 mg/dL — ABNORMAL HIGH (ref 65–99)
POTASSIUM: 3.3 mmol/L — AB (ref 3.5–5.1)
Sodium: 137 mmol/L (ref 135–145)
Total Bilirubin: 1 mg/dL (ref 0.3–1.2)
Total Protein: 8.5 g/dL — ABNORMAL HIGH (ref 6.5–8.1)

## 2017-12-09 LAB — FOLATE: FOLATE: 5.3 ng/mL — AB (ref >5.9)

## 2017-12-09 LAB — RETICULOCYTES
RBC.: 3.59 MIL/uL — ABNORMAL LOW (ref 3.80–5.20)
Retic Count, Absolute: 64.6 10*3/uL (ref 19.0–183.0)
Retic Ct Pct: 1.8 % (ref 0.4–3.1)

## 2017-12-09 LAB — IRON AND TIBC
Iron: 91 ug/dL (ref 28–170)
SATURATION RATIOS: 27 % (ref 10.4–31.8)
TIBC: 335 ug/dL (ref 250–450)
UIBC: 244 ug/dL

## 2017-12-09 LAB — SEDIMENTATION RATE: Sed Rate: 24 mm/hr (ref 0–30)

## 2017-12-09 LAB — FERRITIN: Ferritin: 443 ng/mL — ABNORMAL HIGH (ref 11–307)

## 2017-12-09 LAB — VITAMIN B12: VITAMIN B 12: 192 pg/mL (ref 180–914)

## 2017-12-09 MED ORDER — FOLIC ACID 1 MG PO TABS: 1.0000 mg | ORAL_TABLET | Freq: Every day | ORAL | 3 refills | Status: DC

## 2017-12-09 NOTE — Progress Notes (Signed)
Hematology/Oncology Consult note [facility] Telephone:([phone] [phone]  Patient Care Team: [facility] as PCP - General Bary Castilla Forest Gleason, MD (General Surgery)   Name of the patient: Laurie Gross  [id number]  May 30, 1956    Reason for referral- anemia and elevated ferrritin   Referring physician- Dr. Clayborn Bigness  Date of visit: 12/09/17   History of presenting illness-patient is a 62 year old African-American female with a past medical history significant for hypertension, hyperlipidemia and GERD among other medical problems.  She has been referred to [place] for evaluation and management of anemia as well as elevated ferritin.  On review of outside labs patient has had chronic anemia and her hemoglobin has been between 9-10 since 2015.  And her MCV ranges between 74-80.  Hemoglobin electrophoresis in the past has been normal.  Most recent CBC from 11/06/2017 showed white count of 4.2, H&H of 8.9/28.2 with an MCV of 84 and a platelet count of 314.  TSH was within normal limits.  CMP was within normal limits.  Recent iron studies from 11/06/2017 showed elevated ferritin of 610.  Iron saturation, serum iron and TIBC were normal.  Of note patient has had an elevated ferritin since July 2017 that has been gradually increasing from 335 11/04/1966 to 662 in December 2018.  Transferrin saturation has always been less than 35%.  LFTs have always been normal.  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  She reports feeling mild fatigue but denies any changes in her appetite or unintentional weight loss.  She denies any lumps or bumps anywhere or drenching night sweats.  Her last mammogram was in November 2017 which was unremarkable.  She has been a lifetime non-smoker and does not drink any alcohol.  She denies any symptoms of joint pain or swelling or skin rash.  She admits to being anxious at the doctor's visit today.  Her last CT abdomen was  in 2015 when she had changes of acute pancreatitis.  There were no other overt abnormalities noted on CT scan at that time.  Last EGD in 2012 showed chronic gastritis and a 10 mm polyp in the transverse colon it was noted at that time.  She also had a repeat EGD and colonoscopy in July 2018.  I do not have those results today.  ECOG PS- 0  Pain scale- 0   Review of systems- Review of Systems  Constitutional: Positive for malaise/fatigue. Negative for chills, fever and weight loss.  HENT: Negative for congestion, ear discharge and nosebleeds.   Eyes: Negative for blurred vision.  Respiratory: Negative for cough, hemoptysis, sputum production, shortness of breath and wheezing.   Cardiovascular: Negative for chest pain, palpitations, orthopnea and claudication.  Gastrointestinal: Negative for abdominal pain, blood in stool, constipation, diarrhea, heartburn, melena, nausea and vomiting.  Genitourinary: Negative for dysuria, flank pain, frequency, hematuria and urgency.  Musculoskeletal: Negative for back pain, joint pain and myalgias.  Skin: Negative for rash.  Neurological: Negative for dizziness, tingling, focal weakness, seizures, weakness and headaches.  Endo/Heme/Allergies: Does not bruise/bleed easily.  Psychiatric/Behavioral: Negative for depression and suicidal ideas. The patient does not have insomnia.     Allergies  Allergen Reactions  . Hydrochlorothiazide Other (See Comments)    History of pancreatitis  . Amoxicillin Nausea Only  . Meloxicam Other (See Comments)  . Penicillins Nausea Only  . Simvastatin Nausea Only  . Sulfamethoxazole-Trimethoprim Nausea Only  . Tramadol Nausea Only    Patient  Active Problem List   Diagnosis Date Noted  . Family history of colon cancer 04/08/2017  . Hx of colonic polyps 04/08/2017  . Cervical radiculopathy 12/15/2014  . Anemia 02/14/2014  . Vitamin D deficiency, unspecified 02/14/2014  . GERD (gastroesophageal reflux disease)  02/12/2014  . Hypertension 02/12/2014  . Pseudocyst of pancreas 10/01/2013     Past Medical History:  Diagnosis Date  . Anemia   . Breast screening, unspecified   . Family history of colonic polyps   . Lump or mass in breast   . Neoplasm of uncertain behavior of breast   . Personal history of colonic polyps   . Special screening for malignant neoplasms, colon   . Unspecified essential hypertension      Past Surgical History:  Procedure Laterality Date  . BREAST BIOPSY Right Age 64  . BREAST BIOPSY Left 07-21-12   Core biopsy showed atypical lobular hyperplasia and a 2 mm foci, atypical ductal hyperplasia and a 1 mm foci. There was no evidence of in situ or invasive cancer  . BREAST CYST ASPIRATION  August 2013   inconclusive, with atypical ductal cells identified  . COLONOSCOPY  2002, 2005, 2009, 2012   polyps   . TOTAL ABDOMINAL HYSTERECTOMY W/ BILATERAL SALPINGOOPHORECTOMY  Age 58    Social History   Socioeconomic History  . Marital status: Married    Spouse name: Not on file  . Number of children: Not on file  . Years of education: Not on file  . Highest education level: Not on file  Social Needs  . Financial resource strain: Not on file  . Food insecurity - worry: Not on file  . Food insecurity - inability: Not on file  . Transportation needs - medical: Not on file  . Transportation needs - non-medical: Not on file  Occupational History  . Not on file  Tobacco Use  . Smoking status: Never Smoker  . Smokeless tobacco: Never Used  Substance and Sexual Activity  . Alcohol use: Yes    Comment: occassionally  . Drug use: No  . Sexual activity: Not on file  Other Topics Concern  . Not on file  Social History Narrative  . Not on file     Family History  Problem Relation Age of Onset  . Colon cancer Father   . Colon cancer Other        Paternal first cousin  . Hypertension Mother   . Anemia Mother      Current Outpatient Medications:  .  amLODipine  (NORVASC) 10 MG tablet, Take 1 tablet (10 mg total) by mouth daily., Disp: 30 tablet, Rfl: 10 .  Cholecalciferol (VITAMIN D-3) 5000 units TABS, Take 5,000 Units by mouth., Disp: , Rfl:  .  docusate sodium (COLACE) 100 MG capsule, Take 100 mg by mouth 2 (two) times daily., Disp: , Rfl:  .  lisinopril (PRINIVIL,ZESTRIL) 40 MG tablet, Take 1 tablet (40 mg total) by mouth daily., Disp: 30 tablet, Rfl: 11 .  niacin (NIASPAN) 500 MG CR tablet, Take 1 tablet (500 mg total) by mouth at bedtime., Disp: 30 tablet, Rfl: 11 .  pantoprazole (PROTONIX) 40 MG tablet, Take 1 tablet (40 mg total) by mouth daily., Disp: 30 tablet, Rfl: 12 .  Saccharomyces boulardii (FLORASTOR PO), Take by mouth., Disp: , Rfl:    Physical exam:  Vitals:   12/09/17 1112  BP: (!) 154/71  Pulse: (!) 134  Resp: (!) 22  Temp: 98.8 F (37.1 C)  TempSrc: Tympanic  Weight: 154 lb 10.4 oz (70.1 kg)  Height: _0  (1.702 m)   Physical Exam  Constitutional: She is oriented to person, place, and time and well-developed, well-nourished, and in no distress.  HENT:  Head: Normocephalic and atraumatic.  Eyes: EOM are normal. Pupils are equal, round, and reactive to light.  Neck: Normal range of motion.  Cardiovascular: Regular rhythm and normal heart sounds.  Tachycardic  Pulmonary/Chest: Effort normal and breath sounds normal.  Abdominal: Soft. Bowel sounds are normal.  No palpable hepatosplenomegaly  Lymphadenopathy:  No palpable cervical, supraclavicular, axillary or inguinal adenopathy  Neurological: She is alert and oriented to person, place, and time.  Skin: Skin is warm and dry.       CMP Latest Ref Rng & Units 11/06/2017  Glucose 65 - 99 mg/dL 93  BUN 8 - 27 mg/dL 9  Creatinine 0.57 - 1.00 mg/dL 0.53(L)  Sodium 134 - 144 mmol/L 144  Potassium 3.5 - 5.2 mmol/L 3.6  Chloride 96 - 106 mmol/L 101  CO2 20 - 29 mmol/L 26  Calcium 8.7 - 10.3 mg/dL 9.6  Total Protein 6.0 - 8.5 g/dL 7.2  Total Bilirubin 0.0 - 1.2 mg/dL  0.7  Alkaline Phos 39 - 117 IU/L 46  AST 0 - 40 IU/L 21  ALT 0 - 32 IU/L 11   CBC Latest Ref Rng & Units 11/06/2017  WBC 3.4 - 10.8 x10E3/uL 4.2  Hemoglobin 11.1 - 15.9 g/dL 8.9(L)  Hematocrit 34.0 - 46.6 % 28.2(L)  Platelets 150 - 379 x10E3/uL 314     Assessment and plan- Patient is a 62 y.o. female referred for normocytic anemia and elevated ferritin  1.  Normocytic anemia: Patient has had a long-standing anemia and her hemoglobin has been around 10 since 2015 but is lower now at 8.9 based on recent labs.  Hemoglobin electrophoresis done in the past was normal.  Recent iron studies from a month ago showed an elevated ferritin of 610 but otherwise iron studies were normal.  TSH was normal.  Urinalysis did not reveal any hematuria.  Today I will do a complete anemia workup including CBC with differential, CMP, ferritin and iron studies, B12, folate, reticulocyte count, haptoglobin, myeloma panel and serum free light chains.  2.  Elevated ferritin: It could be an acute phase reactant but the secondary cause for elevated ferritin is currently unclear.  Given that her transferrin saturation is always been less than or equal to 35% I do not feel that the patient has hereditary hemochromatosis especially in the absence of abnormal LFTs.  I will check her ESR along with ANA comprehensive panel  3.  Tachycardia: Her heart rate is regular and patient reports feeling extremely anxious coming for doctor's visits.  She reports no palpitations or chest pain when she is not at her doctor's office.  This can be monitored by her primary care doctor  I will see her back in 2 weeks time to discuss the results of her blood work and further workup.  If her cause of anemia remains uncertain I may have to consider doing a bone marrow biopsy given that she has significant anemia   Thank you for this kind referral and the opportunity to participate in the care of this  Patient   Visit Diagnosis 1. Normocytic  anemia     Dr. Randa Evens, MD, MPH [facility] at [facility] Pager- [phone] 12/09/2017  12:22 PM

## 2017-12-09 NOTE — Telephone Encounter (Signed)
Left VM message for patient re: low folate level and the need for PO Folic Acid 1 mg PO daily, per Dr. Elroy Channel request. Patient is to call me back to let me know that she got the message. RX sent to patient pharmacy for Folic Acid 1 mg PO daily.        dhs

## 2017-12-10 LAB — ANA COMPREHENSIVE PANEL
Anti JO-1: <0.2 AI (ref 0.0–0.9)
Chromatin Ab SerPl-aCnc: <0.2 AI (ref 0.0–0.9)
ENA SM Ab Ser-aCnc: <0.2 AI (ref 0.0–0.9)
SSA (Ro) (ENA) Antibody, IgG: <0.2 AI (ref 0.0–0.9)
SSB (La) (ENA) Antibody, IgG: <0.2 AI (ref 0.0–0.9)
Scleroderma (Scl-70) (ENA) Antibody, IgG: <0.2 AI (ref 0.0–0.9)
ds DNA Ab: <1 IU/mL (ref 0–9)

## 2017-12-10 LAB — HAPTOGLOBIN: Haptoglobin: 91 mg/dL (ref 34–200)

## 2017-12-10 LAB — KAPPA/LAMBDA LIGHT CHAINS
KAPPA, LAMDA LIGHT CHAIN RATIO: 1.21 (ref 0.26–1.65)
Kappa free light chain: 24.9 mg/L — ABNORMAL HIGH (ref 3.3–19.4)
LAMDA FREE LIGHT CHAINS: 20.5 mg/L (ref 5.7–26.3)

## 2017-12-11 ENCOUNTER — Telehealth: Payer: Self-pay | Admitting: *Deleted

## 2017-12-11 LAB — MULTIPLE MYELOMA PANEL, SERUM
ALBUMIN/GLOB SERPL: 1.5 (ref 0.7–1.7)
ALPHA2 GLOB SERPL ELPH-MCNC: 0.8 g/dL (ref 0.4–1.0)
Albumin SerPl Elph-Mcnc: 4.8 g/dL — ABNORMAL HIGH (ref 2.9–4.4)
Alpha 1: 0.2 g/dL (ref 0.0–0.4)
B-Globulin SerPl Elph-Mcnc: 1.1 g/dL (ref 0.7–1.3)
Gamma Glob SerPl Elph-Mcnc: 1.2 g/dL (ref 0.4–1.8)
Globulin, Total: 3.3 g/dL (ref 2.2–3.9)
IGA: 372 mg/dL — AB (ref 87–352)
IGG (IMMUNOGLOBIN G), SERUM: 1063 mg/dL (ref 700–1600)
IGM (IMMUNOGLOBULIN M), SRM: 277 mg/dL — AB (ref 26–217)
Total Protein ELP: 8.1 g/dL (ref 6.0–8.5)

## 2017-12-11 NOTE — Telephone Encounter (Addendum)
Per Dr. Elroy Channel request, called patient regarding her low folate and vitamin b12 levels. Instructed patient to start taking 1000 mcg of Vitamin B12 PO daily and 1 mg of Folic Acid acid PO daily. Folic acid was e-scribed to the patient's pharmacy and Vitamin B12 can be picked up over the counter. Patient verbalized understanding.       dhs

## 2017-12-20 ENCOUNTER — Encounter: Payer: Self-pay | Admitting: Oncology

## 2017-12-20 ENCOUNTER — Inpatient Hospital Stay: Payer: BLUE CROSS/BLUE SHIELD | Admitting: Oncology

## 2017-12-20 VITALS — BP 169/70 | HR 102 | Temp 98.9°F | Resp 18 | Ht 67.0 in | Wt 152.4 lb

## 2017-12-20 DIAGNOSIS — D649 Anemia, unspecified: Secondary | ICD-10-CM

## 2017-12-20 DIAGNOSIS — E538 Deficiency of other specified B group vitamins: Secondary | ICD-10-CM

## 2017-12-20 DIAGNOSIS — R7989 Other specified abnormal findings of blood chemistry: Secondary | ICD-10-CM | POA: Diagnosis not present

## 2017-12-20 DIAGNOSIS — R000 Tachycardia, unspecified: Secondary | ICD-10-CM | POA: Diagnosis not present

## 2017-12-20 NOTE — Progress Notes (Signed)
No new changes noted today 

## 2017-12-23 NOTE — Progress Notes (Signed)
Hematology/Oncology Consult note [facility]  [phone] [phone]  Patient Care Team: [facility] as PCP - General Bary Castilla Forest Gleason, MD (General Surgery)   Name of the patient: Laurie Gross  [id number]  [birth year]   Date of visit: 12/23/17  Diagnosis- 1. Normocytic anemia of uncertain etiology  2. Elevated ferritin probably reactive  Chief complaint/ Reason for visit- discuss results of blodwork  Heme/Onc history: patient is a 61 year old African-American female with a past medical history significant for hypertension, hyperlipidemia and GERD among other medical problems.  She has been referred to [place] for evaluation and management of anemia as well as elevated ferritin.  On review of outside labs patient has had chronic anemia and her hemoglobin has been between 9-10 since 2015.  And her MCV ranges between 74-80.  Hemoglobin electrophoresis in the past has been normal.  Most recent CBC from 11/06/2017 showed white count of 4.2, H&H of 8.9/28.2 with an MCV of 84 and a platelet count of 314.  TSH was within normal limits.  CMP was within normal limits.  Recent iron studies from 11/06/2017 showed elevated ferritin of 610.  Iron saturation, serum iron and TIBC were normal.  Of note patient has had an elevated ferritin since July 2017 that has been gradually increasing from 335 11/04/1966 to 662 in December 2018.  Transferrin saturation has always been less than 35%.  LFTs have always been normal.  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  She reports feeling mild fatigue but denies any changes in her appetite or unintentional weight loss.  She denies any lumps or bumps anywhere or drenching night sweats.  Her last mammogram was in November 2017 which was unremarkable.  She has been a lifetime non-smoker and does not drink any alcohol.  She denies any symptoms of joint pain or swelling or skin rash.  She admits to being  anxious at the doctor's visit today.  Her last CT abdomen was in 2015 when she had changes of acute pancreatitis.  There were no other overt abnormalities noted on CT scan at that time.  Last EGD in 2012 showed chronic gastritis and a 10 mm polyp in the transverse colon it was noted at that time.  She also had a repeat EGD and colonoscopy in July 2018.    Results of blood work from 12/09/2017 were as follows: CBC showed white count of 5.5, H&H of 10.1/30.7 with an MCV of 82.1 and a platelet count of 356.  Differential was normal.  CMP was essentially normal except for a mildly low potassium of 3.3 and elevated blood sugar of 150.  LFTs and creatinine was normal serum calcium was normal.  Ferritin levels were mildly elevated at 443 lower compared to a prior value of 610.  Iron studies were within normal limits.  Folate was mildly low at 5.3.  B12 levels were low at 192.  ESR was normal and ANA comprehensive panel was negative.  Multiple myeloma panel revealed no monoclonal protein.  Polyclonal gammopathy was noted.  Serum free light chains revealed mildly elevated kappa of 24 with a normal kappa lambda ratio 1.2.  Reticulocyte count was low normal at 1.8.  Interval history-following her blood patient was advised to take oral B12 thousand micrograms p.o. daily along with folic acid 1 mg p.o. daily.  She reports feeling better since then.  Her energy levels have improved.  Her appetite is good and she denies  any unintentional weight loss although I do notice a 4 pound weight loss in 2 weeks  ECOG PS- 0 Pain scale- 0   Review of systems- Review of Systems  Constitutional: Negative for chills, fever, malaise/fatigue and weight loss.  HENT: Negative for congestion, ear discharge and nosebleeds.   Eyes: Negative for blurred vision.  Respiratory: Negative for cough, hemoptysis, sputum production, shortness of breath and wheezing.   Cardiovascular: Negative for chest pain, palpitations, orthopnea and  claudication.  Gastrointestinal: Negative for abdominal pain, blood in stool, constipation, diarrhea, heartburn, melena, nausea and vomiting.  Genitourinary: Negative for dysuria, flank pain, frequency, hematuria and urgency.  Musculoskeletal: Negative for back pain, joint pain and myalgias.  Skin: Negative for rash.  Neurological: Negative for dizziness, tingling, focal weakness, seizures, weakness and headaches.  Endo/Heme/Allergies: Does not bruise/bleed easily.  Psychiatric/Behavioral: Negative for depression and suicidal ideas. The patient is nervous/anxious. The patient does not have insomnia.       Allergies  Allergen Reactions  . Hydrochlorothiazide Other (See Comments)    History of pancreatitis  . Amoxicillin Nausea Only  . Meloxicam Other (See Comments)  . Penicillins Nausea Only  . Simvastatin Nausea Only  . Sulfamethoxazole-Trimethoprim Nausea Only  . Tramadol Nausea Only     Past Medical History:  Diagnosis Date  . Anemia   . Breast screening, unspecified   . Family history of colonic polyps   . Lump or mass in breast   . Neoplasm of uncertain behavior of breast   . Personal history of colonic polyps   . Special screening for malignant neoplasms, colon   . Unspecified essential hypertension      Past Surgical History:  Procedure Laterality Date  . BREAST BIOPSY Right Age 24  . BREAST BIOPSY Left 07-21-12   Core biopsy showed atypical lobular hyperplasia and a 2 mm foci, atypical ductal hyperplasia and a 1 mm foci. There was no evidence of in situ or invasive cancer  . BREAST CYST ASPIRATION  August 2013   inconclusive, with atypical ductal cells identified  . COLONOSCOPY  2002, 2005, 2009, 2012   polyps   . TOTAL ABDOMINAL HYSTERECTOMY W/ BILATERAL SALPINGOOPHORECTOMY  Age 4    Social History   Socioeconomic History  . Marital status: Married    Spouse name: Not on file  . Number of children: Not on file  . Years of education: Not on file  .  Highest education level: Not on file  Occupational History  . Not on file  Social Needs  . Financial resource strain: Not on file  . Food insecurity:    Worry: Not on file    Inability: Not on file  . Transportation needs:    Medical: Not on file    Non-medical: Not on file  Tobacco Use  . Smoking status: Never Smoker  . Smokeless tobacco: Never Used  Substance and Sexual Activity  . Alcohol use: Yes    Comment: occassionally  . Drug use: No  . Sexual activity: Not on file  Lifestyle  . Physical activity:    Days per week: Not on file    Minutes per session: Not on file  . Stress: Not on file  Relationships  . Social connections:    Talks on phone: Not on file    Gets together: Not on file    Attends religious service: Not on file    Active member of club or organization: Not on file    Attends meetings of clubs  or organizations: Not on file    Relationship status: Not on file  . Intimate partner violence:    Fear of current or ex partner: Not on file    Emotionally abused: Not on file    Physically abused: Not on file    Forced sexual activity: Not on file  Other Topics Concern  . Not on file  Social History Narrative  . Not on file    Family History  Problem Relation Age of Onset  . Colon cancer Father   . Colon cancer Other        Paternal first cousin  . Hypertension Mother   . Anemia Mother      Current Outpatient Medications:  .  amLODipine (NORVASC) 10 MG tablet, Take 1 tablet (10 mg total) by mouth daily., Disp: 30 tablet, Rfl: 10 .  Cholecalciferol (VITAMIN D-3) 5000 units TABS, Take 5,000 Units by mouth., Disp: , Rfl:  .  docusate sodium (COLACE) 100 MG capsule, Take 100 mg by mouth 2 (two) times daily., Disp: , Rfl:  .  folic acid (FOLVITE) 1 MG tablet, Take 1 tablet (1 mg total) by mouth daily., Disp: 30 tablet, Rfl: 3 .  lisinopril (PRINIVIL,ZESTRIL) 40 MG tablet, Take 1 tablet (40 mg total) by mouth daily., Disp: 30 tablet, Rfl: 11 .  niacin  (NIASPAN) 500 MG CR tablet, Take 1 tablet (500 mg total) by mouth at bedtime., Disp: 30 tablet, Rfl: 11 .  pantoprazole (PROTONIX) 40 MG tablet, Take 1 tablet (40 mg total) by mouth daily., Disp: 30 tablet, Rfl: 12 .  Saccharomyces boulardii (FLORASTOR PO), Take by mouth., Disp: , Rfl:   Physical exam:  Vitals:   12/20/17 0949  BP: (!) 169/70  Pulse: (!) 102  Resp: 18  Temp: 98.9 F (37.2 C)  TempSrc: Tympanic  SpO2: 99%  Weight: 152 lb 6.4 oz (69.1 kg)  Height: 5' 7"  (1.702 m)   Physical Exam  Constitutional: She is oriented to person, place, and time and well-developed, well-nourished, and in no distress.  HENT:  Head: Normocephalic and atraumatic.  Eyes: Pupils are equal, round, and reactive to light. EOM are normal.  Neck: Normal range of motion.  Cardiovascular: Regular rhythm and normal heart sounds.  Tachycardic  Pulmonary/Chest: Effort normal and breath sounds normal.  Abdominal: Soft. Bowel sounds are normal.  Neurological: She is alert and oriented to person, place, and time.  Skin: Skin is warm and dry.     CMP Latest Ref Rng & Units 12/09/2017  Glucose 65 - 99 mg/dL 150(H)  BUN 6 - 20 mg/dL 10  Creatinine 0.44 - 1.00 mg/dL 0.60  Sodium 135 - 145 mmol/L 137  Potassium 3.5 - 5.1 mmol/L 3.3(L)  Chloride 101 - 111 mmol/L 101  CO2 22 - 32 mmol/L 21(L)  Calcium 8.9 - 10.3 mg/dL 9.2  Total Protein 6.5 - 8.1 g/dL 8.5(H)  Total Bilirubin 0.3 - 1.2 mg/dL 1.0  Alkaline Phos 38 - 126 U/L 45  AST 15 - 41 U/L 30  ALT 14 - 54 U/L 13(L)   CBC Latest Ref Rng & Units 12/09/2017  WBC 3.6 - 11.0 K/uL 5.5  Hemoglobin 12.0 - 16.0 g/dL 10.1(L)  Hematocrit 35.0 - 47.0 % 30.7(L)  Platelets 150 - 440 K/uL 356     Assessment and plan- Patient is a 62 y.o. female referred for normocytic anemia and elevated ferritin  1.  Normocytic anemia: Patient has a baseline hemoglobin of 10 since 2015 and her values from  12/09/2017 have essentially been unchanged over the last 4 years.   Extensive anemia workup as above does not reveal an obvious cause of her anemia other than low B12 folate levels.  She is currently on B12 and folate supplements and reports feeling somewhat better.  I will repeat her CBC with ferritin and iron studies as well as B12 and folate in 3 months and see her following that.  B12 and folate deficiency may be partly contributing to her anemia which she has had long-standing chronic normocytic anemia which has remained unchanged over the last 4 years and I am therefore not inclined to perform a bone marrow biopsy at this time unless her anemia worsens  2.  Elevated ferritin: Repeat ferritin checked was mildly elevated at 440 which was lower as compared to a prior value.  She does not have any abnormal LFTs are known chronic liver disease.  This is probably reactive and can be continued to be monitored   Visit Diagnosis 1. Normocytic anemia   2. B12 deficiency   3. Folate deficiency   4. Elevated ferritin      Dr. Randa Evens, MD, MPH [facility] at [facility] Pager- [phone] 12/23/2017 9:43 AM

## 2017-12-26 ENCOUNTER — Encounter: Payer: Self-pay | Admitting: Family Medicine

## 2018-03-17 DIAGNOSIS — Z1231 Encounter for screening mammogram for malignant neoplasm of breast: Secondary | ICD-10-CM | POA: Diagnosis not present

## 2018-03-24 ENCOUNTER — Inpatient Hospital Stay: Payer: BLUE CROSS/BLUE SHIELD | Attending: Oncology

## 2018-03-24 ENCOUNTER — Encounter: Payer: Self-pay | Admitting: Oncology

## 2018-03-24 ENCOUNTER — Inpatient Hospital Stay: Payer: BLUE CROSS/BLUE SHIELD | Admitting: Oncology

## 2018-03-24 VITALS — BP 160/65 | HR 120 | Temp 98.8°F | Resp 18 | Ht 67.0 in | Wt 152.7 lb

## 2018-03-24 DIAGNOSIS — I10 Essential (primary) hypertension: Secondary | ICD-10-CM | POA: Diagnosis not present

## 2018-03-24 DIAGNOSIS — D649 Anemia, unspecified: Secondary | ICD-10-CM

## 2018-03-24 DIAGNOSIS — E538 Deficiency of other specified B group vitamins: Secondary | ICD-10-CM | POA: Diagnosis not present

## 2018-03-24 DIAGNOSIS — Z79899 Other long term (current) drug therapy: Secondary | ICD-10-CM | POA: Diagnosis not present

## 2018-03-24 DIAGNOSIS — D890 Polyclonal hypergammaglobulinemia: Secondary | ICD-10-CM | POA: Diagnosis not present

## 2018-03-24 DIAGNOSIS — E785 Hyperlipidemia, unspecified: Secondary | ICD-10-CM | POA: Insufficient documentation

## 2018-03-24 LAB — IRON AND TIBC
IRON: 99 ug/dL (ref 28–170)
Saturation Ratios: 32 % — ABNORMAL HIGH (ref 10.4–31.8)
TIBC: 305 ug/dL (ref 250–450)
UIBC: 207 ug/dL

## 2018-03-24 LAB — CBC WITH DIFFERENTIAL/PLATELET
BASOS ABS: 0.1 10*3/uL (ref 0–0.1)
BASOS PCT: 1 %
EOS ABS: 0 10*3/uL (ref 0–0.7)
Eosinophils Relative: 1 %
HCT: 29.7 % — ABNORMAL LOW (ref 35.0–47.0)
HEMOGLOBIN: 9.7 g/dL — AB (ref 12.0–16.0)
Lymphocytes Relative: 30 %
Lymphs Abs: 1.9 10*3/uL (ref 1.0–3.6)
MCH: 25.6 pg — ABNORMAL LOW (ref 26.0–34.0)
MCHC: 32.6 g/dL (ref 32.0–36.0)
MCV: 78.4 fL — ABNORMAL LOW (ref 80.0–100.0)
MONOS PCT: 6 %
Monocytes Absolute: 0.4 10*3/uL (ref 0.2–0.9)
NEUTROS PCT: 62 %
Neutro Abs: 4 10*3/uL (ref 1.4–6.5)
Platelets: 323 10*3/uL (ref 150–440)
RBC: 3.79 MIL/uL — ABNORMAL LOW (ref 3.80–5.20)
RDW: 14.8 % — ABNORMAL HIGH (ref 11.5–14.5)
WBC: 6.4 10*3/uL (ref 3.6–11.0)

## 2018-03-24 LAB — FERRITIN: FERRITIN: 374 ng/mL — AB (ref 11–307)

## 2018-03-24 LAB — FOLATE: FOLATE: 86.3 ng/mL (ref >5.9)

## 2018-03-24 LAB — VITAMIN B12: Vitamin B-12: 763 pg/mL (ref 180–914)

## 2018-03-24 NOTE — Progress Notes (Signed)
Hematology/Oncology Consult note [facility]  [phone] [phone]  Patient Care Team: Lavera Guise, MD as PCP - General (Internal Medicine) Bary Castilla Forest Gleason, MD (General Surgery)   Name of the patient: Laurie Gross  [id number]  01-25-1956   Date of visit: 03/24/18  Diagnosis- normocytic anemia  Chief complaint/ Reason for visit- routine f/u of anemia  Heme/Onc history: patient is a 62 year old African-American female with a past medical history significant for hypertension, hyperlipidemia and GERD among other medical problems. She has been referred to [place] for evaluation and management of anemia as well as elevated ferritin.  On review of outside labs patient has had chronic anemia and her hemoglobin has been between 9-10 since 2015. And her MCV ranges between 74-80. Hemoglobin electrophoresis in the past has been normal. Most recent CBC from 11/06/2017 showed white count of 4.2, H&H of 8.9/28.2 with an MCV of 84 and a platelet count of 314. TSH was within normal limits. CMP was within normal limits. Recent iron studies from 11/06/2017 showed elevated ferritin of 610. Iron saturation, serum iron and TIBC were normal. Of note patient has had an elevated ferritin since July 2017 that has been gradually increasing from 335 11/04/1966 to 662 in December 2018. Transferrin saturation has always been less than 35%. LFTs have always been normal.  Results of blood work from 12/09/2017 were as follows: CBC showed white count of 5.5, H&H of 10.1/30.7 with an MCV of 82.1 and a platelet count of 356.  Differential was normal.  CMP was essentially normal except for a mildly low potassium of 3.3 and elevated blood sugar of 150.  LFTs and creatinine was normal serum calcium was normal.  Ferritin levels were mildly elevated at 443 lower compared to a prior value of 610.  Iron studies were within normal limits.  Folate was mildly low at 5.3.  B12 levels  were low at 192.  ESR was normal and ANA comprehensive panel was negative.  Multiple myeloma panel revealed no monoclonal protein.  Polyclonal gammopathy was noted.  Serum free light chains revealed mildly elevated kappa of 24 with a normal kappa lambda ratio 1.2.  Reticulocyte count was low normal at 1.8.  Interval history- patient reports feeling well since she started taking oral B12 and folate. She gets nervous when she comes for doctors visits. deneis any complaints today  ECOG PS- 0 Pain scale- 0   Review of systems- Review of Systems  Constitutional: Negative for chills, fever, malaise/fatigue and weight loss.  HENT: Negative for congestion, ear discharge and nosebleeds.   Eyes: Negative for blurred vision.  Respiratory: Negative for cough, hemoptysis, sputum production, shortness of breath and wheezing.   Cardiovascular: Negative for chest pain, palpitations, orthopnea and claudication.  Gastrointestinal: Negative for abdominal pain, blood in stool, constipation, diarrhea, heartburn, melena, nausea and vomiting.  Genitourinary: Negative for dysuria, flank pain, frequency, hematuria and urgency.  Musculoskeletal: Negative for back pain, joint pain and myalgias.  Skin: Negative for rash.  Neurological: Negative for dizziness, tingling, focal weakness, seizures, weakness and headaches.  Endo/Heme/Allergies: Does not bruise/bleed easily.  Psychiatric/Behavioral: Negative for depression and suicidal ideas. The patient does not have insomnia.       Allergies  Allergen Reactions  . Hydrochlorothiazide Other (See Comments)    History of pancreatitis  . Amoxicillin Nausea Only  . Meloxicam Other (See Comments)  . Penicillins Nausea Only  . Simvastatin Nausea Only  . Sulfamethoxazole-Trimethoprim Nausea Only  . Tramadol Nausea  Only     Past Medical History:  Diagnosis Date  . Anemia   . Breast screening, unspecified   . Family history of colonic polyps   . Lump or mass in  breast   . Neoplasm of uncertain behavior of breast   . Personal history of colonic polyps   . Special screening for malignant neoplasms, colon   . Unspecified essential hypertension      Past Surgical History:  Procedure Laterality Date  . BREAST BIOPSY Right Age 62  . BREAST BIOPSY Left 07-21-12   Core biopsy showed atypical lobular hyperplasia and a 2 mm foci, atypical ductal hyperplasia and a 1 mm foci. There was no evidence of in situ or invasive cancer  . BREAST CYST ASPIRATION  August 2013   inconclusive, with atypical ductal cells identified  . COLONOSCOPY  2002, 2005, 2009, 2012   polyps   . TOTAL ABDOMINAL HYSTERECTOMY W/ BILATERAL SALPINGOOPHORECTOMY  Age 62    Social History   Socioeconomic History  . Marital status: Married    Spouse name: Not on file  . Number of children: Not on file  . Years of education: Not on file  . Highest education level: Not on file  Occupational History  . Not on file  Social Needs  . Financial resource strain: Not on file  . Food insecurity:    Worry: Not on file    Inability: Not on file  . Transportation needs:    Medical: Not on file    Non-medical: Not on file  Tobacco Use  . Smoking status: Never Smoker  . Smokeless tobacco: Never Used  Substance and Sexual Activity  . Alcohol use: Yes    Comment: occassionally  . Drug use: No  . Sexual activity: Not on file  Lifestyle  . Physical activity:    Days per week: Not on file    Minutes per session: Not on file  . Stress: Not on file  Relationships  . Social connections:    Talks on phone: Not on file    Gets together: Not on file    Attends religious service: Not on file    Active member of club or organization: Not on file    Attends meetings of clubs or organizations: Not on file    Relationship status: Not on file  . Intimate partner violence:    Fear of current or ex partner: Not on file    Emotionally abused: Not on file    Physically abused: Not on file     Forced sexual activity: Not on file  Other Topics Concern  . Not on file  Social History Narrative  . Not on file    Family History  Problem Relation Age of Onset  . Colon cancer Father   . Colon cancer Other        Paternal first cousin  . Hypertension Mother   . Anemia Mother      Current Outpatient Medications:  .  amLODipine (NORVASC) 10 MG tablet, Take 1 tablet (10 mg total) by mouth daily., Disp: 30 tablet, Rfl: 10 .  Cholecalciferol (VITAMIN D-3) 5000 units TABS, Take 5,000 Units by mouth., Disp: , Rfl:  .  folic acid (FOLVITE) 1 MG tablet, Take 1 tablet (1 mg total) by mouth daily., Disp: 30 tablet, Rfl: 3 .  lisinopril (PRINIVIL,ZESTRIL) 40 MG tablet, Take 1 tablet (40 mg total) by mouth daily., Disp: 30 tablet, Rfl: 11 .  niacin (NIASPAN) 500 MG CR  tablet, Take 1 tablet (500 mg total) by mouth at bedtime., Disp: 30 tablet, Rfl: 11 .  pantoprazole (PROTONIX) 40 MG tablet, Take 1 tablet (40 mg total) by mouth daily., Disp: 30 tablet, Rfl: 12 .  vitamin B-12 (CYANOCOBALAMIN) 1000 MCG tablet, Take 1,000 mcg by mouth daily., Disp: , Rfl:  .  docusate sodium (COLACE) 100 MG capsule, Take 100 mg by mouth 2 (two) times daily., Disp: , Rfl:  .  Saccharomyces boulardii (FLORASTOR PO), Take by mouth., Disp: , Rfl:   Physical exam:  Vitals:   03/24/18 1030 03/24/18 1053  BP: (!) 160/65 (!) 160/65  Pulse: (!) 120 (!) 120  Resp: 18   Temp: 98.8 F (37.1 C)   TempSrc: Tympanic   SpO2: 100%   Weight: 152 lb 10.7 oz (69.3 kg)   Height: 5' 7"  (1.702 m)    Physical Exam  Constitutional: She is oriented to person, place, and time.  HENT:  Head: Normocephalic and atraumatic.  Eyes: Pupils are equal, round, and reactive to light. EOM are normal.  Neck: Normal range of motion.  Cardiovascular: Regular rhythm and normal heart sounds.  tachycardic  Pulmonary/Chest: Effort normal and breath sounds normal.  Abdominal: Soft. Bowel sounds are normal.  Neurological: She is alert and  oriented to person, place, and time.  Skin: Skin is warm and dry.     CMP Latest Ref Rng & Units 12/09/2017  Glucose 65 - 99 mg/dL 150(H)  BUN 6 - 20 mg/dL 10  Creatinine 0.44 - 1.00 mg/dL 0.60  Sodium 135 - 145 mmol/L 137  Potassium 3.5 - 5.1 mmol/L 3.3(L)  Chloride 101 - 111 mmol/L 101  CO2 22 - 32 mmol/L 21(L)  Calcium 8.9 - 10.3 mg/dL 9.2  Total Protein 6.5 - 8.1 g/dL 8.5(H)  Total Bilirubin 0.3 - 1.2 mg/dL 1.0  Alkaline Phos 38 - 126 U/L 45  AST 15 - 41 U/L 30  ALT 14 - 54 U/L 13(L)   CBC Latest Ref Rng & Units 03/24/2018  WBC 3.6 - 11.0 K/uL 6.4  Hemoglobin 12.0 - 16.0 g/dL 9.7(L)  Hematocrit 35.0 - 47.0 % 29.7(L)  Platelets 150 - 440 K/uL 323      Assessment and plan- Patient is a 62 y.o. female with normocytic anemia of unclear etiology  Patient has had chronic normocytic anemia with hb between 9.5-10.5 for over 2 years now that has remained stable with no significant change. Peripheral blood work up has been unremarkable so far other than low b12 and folate levels which are being replaced but have not improved her anemia. Symptomatically patient feels better on po b12 and folate. Given that stability of her anemia over 2 years, I am not inclined to do a bone marrow biopsy at this time.  I will repeat cbc in 3 and 6 months and see  Her back in 6 months. Check b12, ferritin and folate gain at 6 months   Visit Diagnosis 1. Normocytic anemia   2. B12 deficiency   3. Folate deficiency      Dr. Randa Evens, MD, MPH [facility] [phone] 03/24/2018 1:07 PM

## 2018-03-24 NOTE — Progress Notes (Signed)
No new changes noted today 

## 2018-03-31 ENCOUNTER — Other Ambulatory Visit: Payer: Self-pay | Admitting: Oncology

## 2018-04-30 ENCOUNTER — Other Ambulatory Visit: Payer: Self-pay | Admitting: Oncology

## 2018-05-07 ENCOUNTER — Ambulatory Visit: Payer: Self-pay | Admitting: Internal Medicine

## 2018-05-21 ENCOUNTER — Ambulatory Visit: Payer: Self-pay | Admitting: Adult Health

## 2018-05-26 DIAGNOSIS — N6314 Unspecified lump in the right breast, lower inner quadrant: Secondary | ICD-10-CM | POA: Diagnosis not present

## 2018-05-26 DIAGNOSIS — N6320 Unspecified lump in the left breast, unspecified quadrant: Secondary | ICD-10-CM | POA: Diagnosis not present

## 2018-05-27 ENCOUNTER — Ambulatory Visit: Payer: Self-pay | Admitting: Adult Health

## 2018-06-14 ENCOUNTER — Other Ambulatory Visit: Payer: Self-pay | Admitting: Oncology

## 2018-06-23 ENCOUNTER — Other Ambulatory Visit: Payer: BLUE CROSS/BLUE SHIELD

## 2018-06-23 ENCOUNTER — Inpatient Hospital Stay: Payer: BLUE CROSS/BLUE SHIELD | Attending: Oncology

## 2018-06-23 DIAGNOSIS — D649 Anemia, unspecified: Secondary | ICD-10-CM

## 2018-06-23 LAB — CBC
HCT: 29.2 % — ABNORMAL LOW (ref 35.0–47.0)
HEMOGLOBIN: 9.5 g/dL — AB (ref 12.0–16.0)
MCH: 25.8 pg — AB (ref 26.0–34.0)
MCHC: 32.5 g/dL (ref 32.0–36.0)
MCV: 79.3 fL — ABNORMAL LOW (ref 80.0–100.0)
PLATELETS: 275 10*3/uL (ref 150–440)
RBC: 3.68 MIL/uL — ABNORMAL LOW (ref 3.80–5.20)
RDW: 14.5 % (ref 11.5–14.5)
WBC: 5.2 10*3/uL (ref 3.6–11.0)

## 2018-06-27 DIAGNOSIS — R197 Diarrhea, unspecified: Secondary | ICD-10-CM | POA: Diagnosis not present

## 2018-06-27 DIAGNOSIS — R112 Nausea with vomiting, unspecified: Secondary | ICD-10-CM | POA: Diagnosis not present

## 2018-06-27 DIAGNOSIS — R000 Tachycardia, unspecified: Secondary | ICD-10-CM | POA: Diagnosis not present

## 2018-06-27 DIAGNOSIS — R1030 Lower abdominal pain, unspecified: Secondary | ICD-10-CM | POA: Diagnosis not present

## 2018-06-27 DIAGNOSIS — R109 Unspecified abdominal pain: Secondary | ICD-10-CM | POA: Diagnosis not present

## 2018-07-08 ENCOUNTER — Ambulatory Visit: Payer: Self-pay | Admitting: Internal Medicine

## 2018-07-08 ENCOUNTER — Encounter: Payer: Self-pay | Admitting: Internal Medicine

## 2018-07-08 DIAGNOSIS — Z23 Encounter for immunization: Secondary | ICD-10-CM

## 2018-07-08 DIAGNOSIS — K2100 Gastro-esophageal reflux disease with esophagitis, without bleeding: Secondary | ICD-10-CM

## 2018-07-08 DIAGNOSIS — I10 Essential (primary) hypertension: Secondary | ICD-10-CM | POA: Diagnosis not present

## 2018-07-08 DIAGNOSIS — D508 Other iron deficiency anemias: Secondary | ICD-10-CM

## 2018-07-08 NOTE — Progress Notes (Signed)
[facility] [place], [place] [address]  Internal MEDICINE  Office Visit Note  Patient Name: Laurie Gross  [id number]  [id number]  Date of Service: 07/08/2018  Chief Complaint  Patient presents with  . Hypertension  . Gastroesophageal Reflux   HPI Pt is feeling to her baseline, hg is improving. She does see hematology.Recent  Bp is better. She is up to date with her PHM,   Current Medication: Outpatient Encounter Medications as of 07/08/2018  Medication Sig  . amLODipine (NORVASC) 10 MG tablet Take 1 tablet (10 mg total) by mouth daily.  . Cholecalciferol (VITAMIN D-3) 5000 units TABS Take 5,000 Units by mouth.  . docusate sodium (COLACE) 100 MG capsule Take 100 mg by mouth 2 (two) times daily.  . folic acid (FOLVITE) 1 MG tablet TAKE 1 TABLET BY MOUTH DAILY  . lisinopril (PRINIVIL,ZESTRIL) 40 MG tablet Take 1 tablet (40 mg total) by mouth daily.  . niacin (NIASPAN) 500 MG CR tablet Take 1 tablet (500 mg total) by mouth at bedtime.  . pantoprazole (PROTONIX) 40 MG tablet Take 1 tablet (40 mg total) by mouth daily.  . Saccharomyces boulardii (FLORASTOR PO) Take by mouth.  . vitamin B-12 (CYANOCOBALAMIN) 1000 MCG tablet Take 1,000 mcg by mouth daily.   No facility-administered encounter medications on file as of 07/08/2018.    Surgical History: Past Surgical History:  Procedure Laterality Date  . BREAST BIOPSY Right Age 24  . BREAST BIOPSY Left 07-21-12   Core biopsy showed atypical lobular hyperplasia and a 2 mm foci, atypical ductal hyperplasia and a 1 mm foci. There was no evidence of in situ or invasive cancer  . BREAST CYST ASPIRATION  August 2013   inconclusive, with atypical ductal cells identified  . COLONOSCOPY  2002, 2005, 2009, 2012   polyps   . TOTAL ABDOMINAL HYSTERECTOMY W/ BILATERAL SALPINGOOPHORECTOMY  Age 62   Medical History: Past Medical History:  Diagnosis Date  . Anemia   . Breast screening, unspecified   . Family history of  colonic polyps   . Lump or mass in breast   . Neoplasm of uncertain behavior of breast   . Personal history of colonic polyps   . Special screening for malignant neoplasms, colon   . Unspecified essential hypertension    Family History: Family History  Problem Relation Age of Onset  . Colon cancer Father   . Colon cancer Other        Paternal first cousin  . Hypertension Mother   . Anemia Mother     Social History   Socioeconomic History  . Marital status: Married    Spouse name: Not on file  . Number of children: Not on file  . Years of education: Not on file  . Highest education level: Not on file  Occupational History  . Not on file  Social Needs  . Financial resource strain: Not on file  . Food insecurity:    Worry: Not on file    Inability: Not on file  . Transportation needs:    Medical: Not on file    Non-medical: Not on file  Tobacco Use  . Smoking status: Never Smoker  . Smokeless tobacco: Never Used  Substance and Sexual Activity  . Alcohol use: Yes    Comment: occassionally  . Drug use: No  . Sexual activity: Not on file  Lifestyle  . Physical activity:    Days per week: Not on file    Minutes  per session: Not on file  . Stress: Not on file  Relationships  . Social connections:    Talks on phone: Not on file    Gets together: Not on file    Attends religious service: Not on file    Active member of club or organization: Not on file    Attends meetings of clubs or organizations: Not on file    Relationship status: Not on file  . Intimate partner violence:    Fear of current or ex partner: Not on file    Emotionally abused: Not on file    Physically abused: Not on file    Forced sexual activity: Not on file  Other Topics Concern  . Not on file  Social History Narrative  . Not on file   Review of Systems  Constitutional: Negative for chills, diaphoresis and fatigue.  HENT: Negative for ear pain, postnasal drip and sinus pressure.   Eyes:  Negative for photophobia, discharge, redness, itching and visual disturbance.  Respiratory: Negative for cough, shortness of breath and wheezing.   Cardiovascular: Negative for chest pain, palpitations and leg swelling.  Gastrointestinal: Negative for abdominal pain, constipation, diarrhea, nausea and vomiting.  Genitourinary: Negative for dysuria and flank pain.  Musculoskeletal: Negative for arthralgias, back pain, gait problem and neck pain.  Skin: Negative for color change.  Allergic/Immunologic: Negative for environmental allergies and food allergies.  Neurological: Negative for dizziness and headaches.  Hematological: Does not bruise/bleed easily.  Psychiatric/Behavioral: Negative for agitation, behavioral problems (depression) and hallucinations.   Vital Signs: BP 130/70   Pulse 84   Resp 16   Ht 5\' 7"  (1.702 m)   Wt 157 lb (71.2 kg)   SpO2 98%   BMI 24.59 kg/m   Physical Exam  Constitutional: She is oriented to person, place, and time. She appears well-developed and well-nourished. No distress.  HENT:  Head: Normocephalic and atraumatic.  Mouth/Throat: Oropharynx is clear and moist. No oropharyngeal exudate.  Eyes: Pupils are equal, round, and reactive to light. EOM are normal.  Neck: Normal range of motion. Neck supple. No JVD present. No tracheal deviation present. No thyromegaly present.  Cardiovascular: Normal rate, regular rhythm and normal heart sounds. Exam reveals no gallop and no friction rub.  No murmur heard. Pulmonary/Chest: Effort normal. No respiratory distress. She has no wheezes. She has no rales. She exhibits no tenderness.  Abdominal: Soft. Bowel sounds are normal.  Musculoskeletal: Normal range of motion.  Lymphadenopathy:    She has no cervical adenopathy.  Neurological: She is alert and oriented to person, place, and time. No cranial nerve deficit.  Skin: Skin is warm and dry. She is not diaphoretic.  Psychiatric: She has a normal mood and affect.  Her behavior is normal. Judgment and thought content normal.   Assessment/Plan: 1. Essential hypertension, benign Controlled with medications  2. Iron deficiency anemia secondary to inadequate dietary iron intake Continue to follow up with heamtology  3. Flu vaccine need - Flu Vaccine MDCK QUAD PF  4. Gastroesophageal reflux disease with esophagitis Controlled, EGD in 2018  General Counseling: Vertell Limber understanding of the findings of todays visit and agrees with plan of treatment. I have discussed any further diagnostic evaluation that may be needed or ordered today. We also reviewed her medications today. she has been encouraged to call the office with any questions or concerns that should arise related to todays visit.  Orders Placed This Encounter  Procedures  . Flu Vaccine MDCK QUAD PF  Time spent:25 Minutes Dr Lavera Guise Internal medicine

## 2018-07-14 ENCOUNTER — Other Ambulatory Visit: Payer: Self-pay | Admitting: Oncology

## 2018-07-14 NOTE — Telephone Encounter (Signed)
;...     Ref Range & Units 3mo ago  Folate >5.9 ng/mL 86.3

## 2018-07-17 DIAGNOSIS — H5203 Hypermetropia, bilateral: Secondary | ICD-10-CM | POA: Diagnosis not present

## 2018-08-18 ENCOUNTER — Other Ambulatory Visit: Payer: Self-pay | Admitting: Oncology

## 2018-08-18 NOTE — Telephone Encounter (Signed)
;...     Ref Range & Units 82mo ago 71mo ago  Folate >5.9 ng/mL 86.3  5.3Low  CM  Comment: RESULT CONFIRMED BY MANUAL DILUTION.TFK/MSS  Performed at [facility], [address] [place], [place] [address]   Resulting Agency  [place] [facility]      Specimen Collected: 03/24/18 10:21 Last Resulted: 03/24/18 19:12

## 2018-08-19 ENCOUNTER — Encounter: Payer: Self-pay | Admitting: Adult Health

## 2018-08-19 ENCOUNTER — Ambulatory Visit: Payer: Self-pay | Admitting: Adult Health

## 2018-08-19 VITALS — BP 158/80 | HR 100 | Temp 97.2°F | Resp 16 | Ht 67.0 in | Wt 158.0 lb

## 2018-08-19 DIAGNOSIS — L03119 Cellulitis of unspecified part of limb: Secondary | ICD-10-CM | POA: Diagnosis not present

## 2018-08-19 DIAGNOSIS — I10 Essential (primary) hypertension: Secondary | ICD-10-CM

## 2018-08-19 DIAGNOSIS — L299 Pruritus, unspecified: Secondary | ICD-10-CM | POA: Diagnosis not present

## 2018-08-19 MED ORDER — BACITRACIN 500 UNIT/GM EX OINT: 1.0000 "application " | TOPICAL_OINTMENT | Freq: Two times a day (BID) | CUTANEOUS | 0 refills | Status: DC

## 2018-08-19 MED ORDER — DOXYCYCLINE HYCLATE 100 MG PO TABS: 100.0000 mg | ORAL_TABLET | Freq: Two times a day (BID) | ORAL | 0 refills | Status: DC

## 2018-08-19 MED ORDER — TRIAMCINOLONE ACETONIDE 0.1 % EX CREA: 1.0000 "application " | TOPICAL_CREAM | Freq: Two times a day (BID) | CUTANEOUS | 0 refills | Status: AC

## 2018-08-19 NOTE — Patient Instructions (Signed)

## 2018-08-19 NOTE — Progress Notes (Signed)
[facility] [place], [place] [address]  Internal MEDICINE  Office Visit Note  Patient Name: Laurie Gross  [id number]  [id number]  Date of Service: 08/19/2018  Chief Complaint  Patient presents with  . Hypertension  . Rash    on both leg     HPI Pt is here for a sick visit.  Patient has a raised red rash to areas of both bilateral lower extremities.  She reports the itch intensely and that over the last couple of weeks they have become larger areas.  She is been using hydrocortisone cream and Neosporin with mild temporary relief.  The areas appear to continue to get larger.  She denies any fever, bleeding, or other symptoms.     Current Medication:  Outpatient Encounter Medications as of 08/19/2018  Medication Sig  . amLODipine (NORVASC) 10 MG tablet Take 1 tablet (10 mg total) by mouth daily.  . Cholecalciferol (VITAMIN D-3) 5000 units TABS Take 5,000 Units by mouth.  . docusate sodium (COLACE) 100 MG capsule Take 100 mg by mouth 2 (two) times daily.  . folic acid (FOLVITE) 1 MG tablet TAKE 1 TABLET BY MOUTH DAILY  . lisinopril (PRINIVIL,ZESTRIL) 40 MG tablet Take 1 tablet (40 mg total) by mouth daily.  . niacin (NIASPAN) 500 MG CR tablet Take 1 tablet (500 mg total) by mouth at bedtime.  . pantoprazole (PROTONIX) 40 MG tablet Take 1 tablet (40 mg total) by mouth daily.  . Saccharomyces boulardii (FLORASTOR PO) Take by mouth.  . vitamin B-12 (CYANOCOBALAMIN) 1000 MCG tablet Take 1,000 mcg by mouth daily.  . bacitracin 500 UNIT/GM ointment Apply 1 application topically 2 (two) times daily.  Marland Kitchen doxycycline (VIBRA-TABS) 100 MG tablet Take 1 tablet (100 mg total) by mouth 2 (two) times daily.  Marland Kitchen triamcinolone cream (KENALOG) 0.1 % Apply 1 application topically 2 (two) times daily.   No facility-administered encounter medications on file as of 08/19/2018.       Medical History: Past Medical History:  Diagnosis Date  . Anemia   . Breast  screening, unspecified   . Family history of colonic polyps   . Lump or mass in breast   . Neoplasm of uncertain behavior of breast   . Personal history of colonic polyps   . Special screening for malignant neoplasms, colon   . Unspecified essential hypertension      Vital Signs: BP (!) 158/80   Pulse 100   Temp (!) 97.2 F (36.2 C)   Resp 16   Ht 5\' 7"  (1.702 m)   Wt 158 lb (71.7 kg)   SpO2 99%   BMI 24.75 kg/m    Review of Systems  Constitutional: Negative for chills, fatigue and unexpected weight change.  HENT: Negative for congestion, rhinorrhea, sneezing and sore throat.   Eyes: Negative for photophobia, pain and redness.  Respiratory: Negative for cough, chest tightness and shortness of breath.   Cardiovascular: Negative for chest pain and palpitations.  Gastrointestinal: Negative for abdominal pain, constipation, diarrhea, nausea and vomiting.  Endocrine: Negative.   Genitourinary: Negative for dysuria and frequency.  Musculoskeletal: Negative for arthralgias, back pain, joint swelling and neck pain.  Skin: Positive for rash.  Allergic/Immunologic: Negative.   Neurological: Negative for tremors and numbness.  Hematological: Negative for adenopathy. Does not bruise/bleed easily.  Psychiatric/Behavioral: Negative for behavioral problems and sleep disturbance. The patient is not nervous/anxious.     Physical Exam  Constitutional: She is oriented to person, place, and time.  She appears well-developed and well-nourished. No distress.  HENT:  Head: Normocephalic and atraumatic.  Mouth/Throat: Oropharynx is clear and moist. No oropharyngeal exudate.  Eyes: Pupils are equal, round, and reactive to light. EOM are normal.  Neck: Normal range of motion. Neck supple. No JVD present. No tracheal deviation present. No thyromegaly present.  Cardiovascular: Normal rate, regular rhythm and normal heart sounds. Exam reveals no gallop and no friction rub.  No murmur  heard. Pulmonary/Chest: Effort normal and breath sounds normal. No respiratory distress. She has no wheezes. She has no rales. She exhibits no tenderness.  Abdominal: Soft. There is no tenderness. There is no guarding.  Musculoskeletal: Normal range of motion.  Lymphadenopathy:    She has no cervical adenopathy.  Neurological: She is alert and oriented to person, place, and time. No cranial nerve deficit.  Skin: Skin is warm and dry. Rash noted. She is not diaphoretic. There is erythema.  Multiple raised dry red areas to BLE.  Varrying in size from 4cmx 4cm, to 1cmx 1cm.   Psychiatric: She has a normal mood and affect. Her behavior is normal. Judgment and thought content normal.  Nursing note and vitals reviewed.  Assessment/Plan: 1. Cellulitis of lower extremity, unspecified laterality Patient has cellulitic area to right lower extremity.  Extreme itching as well as redness and irritation noted to skin surrounding rash area.  Will prescribe topical steroid as well as bacitracin and give patient a 10-day regimen of doxycycline.  Instructed patient to return to clinic if symptoms are no better once antibiotic therapy is complete. - doxycycline (VIBRA-TABS) 100 MG tablet; Take 1 tablet (100 mg total) by mouth 2 (two) times daily.  Dispense: 20 tablet; Refill: 0 - triamcinolone cream (KENALOG) 0.1 %; Apply 1 application topically 2 (two) times daily.  Dispense: 30 g; Refill: 0 - bacitracin 500 UNIT/GM ointment; Apply 1 application topically 2 (two) times daily.  Dispense: 15 g; Refill: 0  2. Essential hypertension, benign BP slightly elevated today, most likely due to intense itching will continue to monitor in the future.  3. Severe itching Instructed patient to use topical steroid as well as take Benadryl as needed. - triamcinolone cream (KENALOG) 0.1 %; Apply 1 application topically 2 (two) times daily.  Dispense: 30 g; Refill: 0 - bacitracin 500 UNIT/GM ointment; Apply 1 application  topically 2 (two) times daily.  Dispense: 15 g; Refill: 0  General Counseling: Landrey verbalizes understanding of the findings of todays visit and agrees with plan of treatment. I have discussed any further diagnostic evaluation that may be needed or ordered today. We also reviewed her medications today. she has been encouraged to call the office with any questions or concerns that should arise related to todays visit.   No orders of the defined types were placed in this encounter.   Meds ordered this encounter  Medications  . doxycycline (VIBRA-TABS) 100 MG tablet    Sig: Take 1 tablet (100 mg total) by mouth 2 (two) times daily.    Dispense:  20 tablet    Refill:  0  . triamcinolone cream (KENALOG) 0.1 %    Sig: Apply 1 application topically 2 (two) times daily.    Dispense:  30 g    Refill:  0  . bacitracin 500 UNIT/GM ointment    Sig: Apply 1 application topically 2 (two) times daily.    Dispense:  15 g    Refill:  0    Time spent: 25 Minutes  This patient was seen  by Orson Gear AGNP-C in Collaboration with Dr Lavera Guise as a part of collaborative care agreement.  Kendell Bane AGNP-C Internal Medicine

## 2018-09-16 ENCOUNTER — Other Ambulatory Visit: Payer: Self-pay | Admitting: Oncology

## 2018-09-22 ENCOUNTER — Ambulatory Visit: Payer: BLUE CROSS/BLUE SHIELD | Admitting: Oncology

## 2018-09-22 ENCOUNTER — Other Ambulatory Visit: Payer: BLUE CROSS/BLUE SHIELD

## 2018-09-30 DIAGNOSIS — R21 Rash and other nonspecific skin eruption: Secondary | ICD-10-CM | POA: Diagnosis not present

## 2018-09-30 DIAGNOSIS — L300 Nummular dermatitis: Secondary | ICD-10-CM | POA: Diagnosis not present

## 2018-10-14 DIAGNOSIS — L300 Nummular dermatitis: Secondary | ICD-10-CM | POA: Diagnosis not present

## 2018-10-17 ENCOUNTER — Other Ambulatory Visit: Payer: Self-pay | Admitting: *Deleted

## 2018-10-17 DIAGNOSIS — D649 Anemia, unspecified: Secondary | ICD-10-CM

## 2018-10-17 DIAGNOSIS — E538 Deficiency of other specified B group vitamins: Secondary | ICD-10-CM

## 2018-10-20 ENCOUNTER — Encounter (INDEPENDENT_AMBULATORY_CARE_PROVIDER_SITE_OTHER): Payer: Self-pay

## 2018-10-20 ENCOUNTER — Inpatient Hospital Stay: Payer: BLUE CROSS/BLUE SHIELD | Attending: Oncology

## 2018-10-20 ENCOUNTER — Inpatient Hospital Stay (HOSPITAL_BASED_OUTPATIENT_CLINIC_OR_DEPARTMENT_OTHER): Payer: BLUE CROSS/BLUE SHIELD | Admitting: Oncology

## 2018-10-20 ENCOUNTER — Encounter: Payer: Self-pay | Admitting: Oncology

## 2018-10-20 VITALS — BP 134/72 | HR 93 | Temp 97.8°F | Resp 16 | Wt 155.0 lb

## 2018-10-20 DIAGNOSIS — E538 Deficiency of other specified B group vitamins: Secondary | ICD-10-CM | POA: Insufficient documentation

## 2018-10-20 DIAGNOSIS — D649 Anemia, unspecified: Secondary | ICD-10-CM

## 2018-10-20 DIAGNOSIS — Z79899 Other long term (current) drug therapy: Secondary | ICD-10-CM | POA: Insufficient documentation

## 2018-10-20 DIAGNOSIS — D509 Iron deficiency anemia, unspecified: Secondary | ICD-10-CM | POA: Diagnosis not present

## 2018-10-20 DIAGNOSIS — D529 Folate deficiency anemia, unspecified: Secondary | ICD-10-CM

## 2018-10-20 DIAGNOSIS — I10 Essential (primary) hypertension: Secondary | ICD-10-CM | POA: Insufficient documentation

## 2018-10-20 LAB — CBC WITH DIFFERENTIAL/PLATELET
Abs Immature Granulocytes: 0.01 10*3/uL (ref 0.00–0.07)
Basophils Absolute: 0 10*3/uL (ref 0.0–0.1)
Basophils Relative: 1 %
Eosinophils Absolute: 0.1 10*3/uL (ref 0.0–0.5)
Eosinophils Relative: 2 %
HCT: 32.8 % — ABNORMAL LOW (ref 36.0–46.0)
Hemoglobin: 10.3 g/dL — ABNORMAL LOW (ref 12.0–15.0)
Immature Granulocytes: 0 %
Lymphocytes Relative: 44 %
Lymphs Abs: 1.9 10*3/uL (ref 0.7–4.0)
MCH: 24.3 pg — ABNORMAL LOW (ref 26.0–34.0)
MCHC: 31.4 g/dL (ref 30.0–36.0)
MCV: 77.4 fL — ABNORMAL LOW (ref 80.0–100.0)
Monocytes Absolute: 0.2 10*3/uL (ref 0.1–1.0)
Monocytes Relative: 5 %
Neutro Abs: 2.1 10*3/uL (ref 1.7–7.7)
Neutrophils Relative %: 48 %
Platelets: 243 10*3/uL (ref 150–400)
RBC: 4.24 MIL/uL (ref 3.87–5.11)
RDW: 14.8 % (ref 11.5–15.5)
WBC: 4.3 10*3/uL (ref 4.0–10.5)
nRBC: 0 % (ref 0.0–0.2)

## 2018-10-20 LAB — FERRITIN: FERRITIN: 479 ng/mL — AB (ref 11–307)

## 2018-10-20 LAB — VITAMIN B12: VITAMIN B 12: 1177 pg/mL — AB (ref 180–914)

## 2018-10-20 NOTE — Progress Notes (Signed)
Hematology/Oncology Consult note [facility]  Telephone:([phone] [phone]  Patient Care Team: Lavera Guise, MD as PCP - General (Internal Medicine) Bary Castilla Forest Gleason, MD (General Surgery)   Name of the patient: Laurie Gross  [id number]  14-Dec-1955   Date of visit: 10/20/18  Diagnosis-normocytic anemia  Chief complaint/ Reason for visit-routine follow-up of anemia  Heme/Onc history: patient is a 63 year old African-American female with a past medical history significant for hypertension, hyperlipidemia and GERD among other medical problems. She has been referred to [place] for evaluation and management of anemia as well as elevated ferritin.  On review of outside labs patient has had chronic anemia and her hemoglobin has been between 9-10 since 2015. And her MCV ranges between 74-80. Hemoglobin electrophoresis in the past has been normal. Most recent CBC from 11/06/2017 showed white count of 4.2, H&H of 8.9/28.2 with an MCV of 84 and a platelet count of 314. TSH was within normal limits. CMP was within normal limits. Recent iron studies from 11/06/2017 showed elevated ferritin of 610. Iron saturation, serum iron and TIBC were normal. Of note patient has had an elevated ferritin since July 2017 that has been gradually increasing from 335 11/04/1966 to 662 in December 2018. Transferrin saturation has always been less than 35%. LFTs have always been normal.  Results of blood work from 12/09/2017 were as follows: CBC showed white count of 5.5, H&H of 10.1/30.7 with an MCV of 82.1 and a platelet count of 356. Differential was normal. CMP was essentially normal except for a mildly low potassium of 3.3 and elevated blood sugar of 150. LFTs and creatinine was normal serum calcium was normal. Ferritin levels were mildly elevated at 443 lowercompared to a prior value of 610. Iron studies were within normal limits. Folate was mildly low at 5.3. B12  levels were low at 192. ESR was normal and ANA comprehensive panel was negative. Multiple myeloma panel revealed no monoclonal protein. Polyclonal gammopathy was noted. Serum free light chains revealed mildly elevated kappa of 24 with a normal kappa lambda ratio 1.2. Reticulocyte count was low normal at 1.8.  Interval history- she reports doing well. She is taking b12 supplements. Feels less stressed and cutting back on her work  ECOG PS- 1 Pain scale- 0   Review of systems- Review of Systems  Constitutional: Negative for chills, fever, malaise/fatigue and weight loss.  HENT: Negative for congestion, ear discharge and nosebleeds.   Eyes: Negative for blurred vision.  Respiratory: Negative for cough, hemoptysis, sputum production, shortness of breath and wheezing.   Cardiovascular: Negative for chest pain, palpitations, orthopnea and claudication.  Gastrointestinal: Negative for abdominal pain, blood in stool, constipation, diarrhea, heartburn, melena, nausea and vomiting.  Genitourinary: Negative for dysuria, flank pain, frequency, hematuria and urgency.  Musculoskeletal: Negative for back pain, joint pain and myalgias.  Skin: Negative for rash.  Neurological: Negative for dizziness, tingling, focal weakness, seizures, weakness and headaches.  Endo/Heme/Allergies: Does not bruise/bleed easily.  Psychiatric/Behavioral: Negative for depression and suicidal ideas. The patient does not have insomnia.        Allergies  Allergen Reactions  . Hydrochlorothiazide Other (See Comments)    History of pancreatitis  . Amoxicillin Nausea Only  . Meloxicam Other (See Comments)  . Penicillins Nausea Only  . Simvastatin Nausea Only  . Sulfamethoxazole-Trimethoprim Nausea Only  . Tramadol Nausea Only     Past Medical History:  Diagnosis Date  . Anemia   . Breast screening, unspecified   .  Eczema   . Family history of colonic polyps   . Lump or mass in breast   . Neoplasm of uncertain  behavior of breast   . Personal history of colonic polyps   . Special screening for malignant neoplasms, colon   . Unspecified essential hypertension      Past Surgical History:  Procedure Laterality Date  . BREAST BIOPSY Right Age 59  . BREAST BIOPSY Left 07-21-12   Core biopsy showed atypical lobular hyperplasia and a 2 mm foci, atypical ductal hyperplasia and a 1 mm foci. There was no evidence of in situ or invasive cancer  . BREAST CYST ASPIRATION  August 2013   inconclusive, with atypical ductal cells identified  . COLONOSCOPY  2002, 2005, 2009, 2012   polyps   . TOTAL ABDOMINAL HYSTERECTOMY W/ BILATERAL SALPINGOOPHORECTOMY  Age 31    Social History   Socioeconomic History  . Marital status: Married    Spouse name: Not on file  . Number of children: Not on file  . Years of education: Not on file  . Highest education level: Not on file  Occupational History  . Not on file  Social Needs  . Financial resource strain: Not on file  . Food insecurity:    Worry: Not on file    Inability: Not on file  . Transportation needs:    Medical: Not on file    Non-medical: Not on file  Tobacco Use  . Smoking status: Never Smoker  . Smokeless tobacco: Never Used  Substance and Sexual Activity  . Alcohol use: Yes    Comment: occassionally  . Drug use: No  . Sexual activity: Not on file  Lifestyle  . Physical activity:    Days per week: Not on file    Minutes per session: Not on file  . Stress: Not on file  Relationships  . Social connections:    Talks on phone: Not on file    Gets together: Not on file    Attends religious service: Not on file    Active member of club or organization: Not on file    Attends meetings of clubs or organizations: Not on file    Relationship status: Not on file  . Intimate partner violence:    Fear of current or ex partner: Not on file    Emotionally abused: Not on file    Physically abused: Not on file    Forced sexual activity: Not on  file  Other Topics Concern  . Not on file  Social History Narrative  . Not on file    Family History  Problem Relation Age of Onset  . Colon cancer Father   . Colon cancer Other        Paternal first cousin  . Hypertension Mother   . Anemia Mother      Current Outpatient Medications:  .  amLODipine (NORVASC) 10 MG tablet, Take 1 tablet (10 mg total) by mouth daily., Disp: 30 tablet, Rfl: 10 .  bacitracin 500 UNIT/GM ointment, Apply 1 application topically 2 (two) times daily., Disp: 15 g, Rfl: 0 .  Cholecalciferol (VITAMIN D-3) 5000 units TABS, Take 5,000 Units by mouth., Disp: , Rfl:  .  docusate sodium (COLACE) 100 MG capsule, Take 100 mg by mouth 2 (two) times daily., Disp: , Rfl:  .  folic acid (FOLVITE) 1 MG tablet, TAKE 1 TABLET BY MOUTH DAILY, Disp: 30 tablet, Rfl: 0 .  lisinopril (PRINIVIL,ZESTRIL) 40 MG tablet, Take 1 tablet (  40 mg total) by mouth daily., Disp: 30 tablet, Rfl: 11 .  Multiple Vitamins-Iron (MULTIVITAMIN/IRON PO), Take 1 capsule by mouth daily., Disp: , Rfl:  .  niacin (NIASPAN) 500 MG CR tablet, Take 1 tablet (500 mg total) by mouth at bedtime., Disp: 30 tablet, Rfl: 11 .  pantoprazole (PROTONIX) 40 MG tablet, Take 1 tablet (40 mg total) by mouth daily., Disp: 30 tablet, Rfl: 12 .  triamcinolone cream (KENALOG) 0.1 %, Apply 1 application topically 2 (two) times daily., Disp: 30 g, Rfl: 0 .  vitamin B-12 (CYANOCOBALAMIN) 1000 MCG tablet, Take 1,000 mcg by mouth daily., Disp: , Rfl:   Physical exam:  Vitals:   10/20/18 0931  BP: 134/72  Pulse: 93  Resp: 16  Temp: 97.8 F (36.6 C)  TempSrc: Oral  Weight: 155 lb (70.3 kg)   Physical Exam HENT:     Head: Normocephalic and atraumatic.  Eyes:     Pupils: Pupils are equal, round, and reactive to light.  Neck:     Musculoskeletal: Normal range of motion.  Cardiovascular:     Rate and Rhythm: Regular rhythm. Tachycardia present.     Heart sounds: Normal heart sounds.  Pulmonary:     Effort: Pulmonary  effort is normal.     Breath sounds: Normal breath sounds.  Abdominal:     General: Bowel sounds are normal.     Palpations: Abdomen is soft.  Skin:    General: Skin is warm and dry.  Neurological:     Mental Status: She is alert and oriented to person, place, and time.      CMP Latest Ref Rng & Units 12/09/2017  Glucose 65 - 99 mg/dL 150(H)  BUN 6 - 20 mg/dL 10  Creatinine 0.44 - 1.00 mg/dL 0.60  Sodium 135 - 145 mmol/L 137  Potassium 3.5 - 5.1 mmol/L 3.3(L)  Chloride 101 - 111 mmol/L 101  CO2 22 - 32 mmol/L 21(L)  Calcium 8.9 - 10.3 mg/dL 9.2  Total Protein 6.5 - 8.1 g/dL 8.5(H)  Total Bilirubin 0.3 - 1.2 mg/dL 1.0  Alkaline Phos 38 - 126 U/L 45  AST 15 - 41 U/L 30  ALT 14 - 54 U/L 13(L)   CBC Latest Ref Rng & Units 10/20/2018  WBC 4.0 - 10.5 K/uL 4.3  Hemoglobin 12.0 - 15.0 g/dL 10.3(L)  Hematocrit 36.0 - 46.0 % 32.8(L)  Platelets 150 - 400 K/uL 243     Assessment and plan- Patient is a 63 y.o. female with chronic normocytic/ microcytic anemia of unclear etiology here for routine f/u of anemia  Patient's baseline hemoglobin runs around 10 over the last 3 to 4 years.  Today her hemoglobin is 10.3 at her baseline.  Over the last 6 months she has developed some mild microcytosis.  Her ferritin is mildly elevated at 479 indicators of microcytic anemia from chronic disease.  B12 levels have normalized after she started taking p.o. B12.  Given that she has stable hemoglobin over the last 3 to 4 years I will hold off on a bone marrow biopsy at this time.  I will see her back in 6 months with CBC with differential, ferritin and iron studies as well as B12 and folate   Visit Diagnosis 1. Normocytic anemia   2. Folate deficiency   3. B12 deficiency      Dr. Randa Evens, MD, MPH [facility] [phone] 10/20/2018 2:38 PM

## 2018-10-20 NOTE — Progress Notes (Signed)
Patient here for 6 month anemia check up. Energy is reported as good. No visible bleeding. Denies any dizziness. Appetite is good.No dyspnea.

## 2018-10-23 ENCOUNTER — Other Ambulatory Visit: Payer: Self-pay | Admitting: Oncology

## 2018-11-13 ENCOUNTER — Ambulatory Visit: Payer: BC Managed Care – PPO | Admitting: Adult Health

## 2018-11-13 ENCOUNTER — Other Ambulatory Visit: Payer: Self-pay

## 2018-11-13 ENCOUNTER — Encounter: Payer: Self-pay | Admitting: Adult Health

## 2018-11-13 VITALS — BP 158/78 | HR 90 | Temp 98.8°F | Resp 16 | Ht 67.0 in | Wt 157.0 lb

## 2018-11-13 DIAGNOSIS — I10 Essential (primary) hypertension: Secondary | ICD-10-CM

## 2018-11-13 DIAGNOSIS — J029 Acute pharyngitis, unspecified: Secondary | ICD-10-CM | POA: Diagnosis not present

## 2018-11-13 DIAGNOSIS — K2100 Gastro-esophageal reflux disease with esophagitis, without bleeding: Secondary | ICD-10-CM

## 2018-11-13 DIAGNOSIS — J0100 Acute maxillary sinusitis, unspecified: Secondary | ICD-10-CM | POA: Diagnosis not present

## 2018-11-13 LAB — POCT RAPID STREP A (OFFICE): Rapid Strep A Screen: NEGATIVE

## 2018-11-13 MED ORDER — AZITHROMYCIN 250 MG PO TABS: ORAL_TABLET | ORAL | 0 refills | Status: DC

## 2018-11-13 NOTE — Progress Notes (Signed)
[facility] [place],  [address]  Internal MEDICINE  Office Visit Note  Patient Name: Laurie Gross  [id number]  [id number]  Date of Service: 11/13/2018  Chief Complaint  Patient presents with  . Sore Throat    started last week      HPI Pt is here for a sick visit. She reports three weeks of cold, and sinus symptoms.  Over the last week she has noticed fullness in her ears, and popping.  She also reports sore throat that she describes as a burning pain.  She has been using OTC multi symptom medications with little relief.  She denies fever/chills, SOB or dizziness.       Current Medication:  Outpatient Encounter Medications as of 11/13/2018  Medication Sig  . amLODipine (NORVASC) 10 MG tablet Take 1 tablet (10 mg total) by mouth daily.  . Cholecalciferol (VITAMIN D-3) 5000 units TABS Take 5,000 Units by mouth.  . docusate sodium (COLACE) 100 MG capsule Take 100 mg by mouth 2 (two) times daily.  . folic acid (FOLVITE) 1 MG tablet TAKE 1 TABLET BY MOUTH EVERY DAY  . lisinopril (PRINIVIL,ZESTRIL) 40 MG tablet Take 1 tablet (40 mg total) by mouth daily.  . Multiple Vitamins-Iron (MULTIVITAMIN/IRON PO) Take 1 capsule by mouth daily.  . pantoprazole (PROTONIX) 40 MG tablet Take 1 tablet (40 mg total) by mouth daily.  . vitamin B-12 (CYANOCOBALAMIN) 1000 MCG tablet Take 1,000 mcg by mouth daily.  Marland Kitchen azithromycin (ZITHROMAX) 250 MG tablet Take as directed.  . bacitracin 500 UNIT/GM ointment Apply 1 application topically 2 (two) times daily. (Patient not taking: Reported on 11/13/2018)  . niacin (NIASPAN) 500 MG CR tablet Take 1 tablet (500 mg total) by mouth at bedtime.  . triamcinolone cream (KENALOG) 0.1 % Apply 1 application topically 2 (two) times daily.   No facility-administered encounter medications on file as of 11/13/2018.       Medical History: Past Medical History:  Diagnosis Date  . Anemia   . Breast screening, unspecified   .  Eczema   . Family history of colonic polyps   . Lump or mass in breast   . Neoplasm of uncertain behavior of breast   . Personal history of colonic polyps   . Special screening for malignant neoplasms, colon   . Unspecified essential hypertension      Vital Signs: BP (!) 158/78   Pulse 90   Temp 98.8 F (37.1 C) (Oral)   Resp 16   Ht 5\' 7"  (1.702 m)   Wt 157 lb (71.2 kg)   SpO2 99%   BMI 24.59 kg/m    Review of Systems  Constitutional: Negative for chills, fatigue and unexpected weight change.  HENT: Positive for ear pain, postnasal drip, rhinorrhea, sinus pressure, sinus pain, sneezing and sore throat. Negative for congestion.   Eyes: Negative for photophobia, pain and redness.  Respiratory: Negative for cough, chest tightness and shortness of breath.   Cardiovascular: Negative for chest pain and palpitations.  Gastrointestinal: Negative for abdominal pain, constipation, diarrhea, nausea and vomiting.  Endocrine: Negative.   Genitourinary: Negative for dysuria and frequency.  Musculoskeletal: Negative for arthralgias, back pain, joint swelling and neck pain.  Skin: Negative for rash.  Allergic/Immunologic: Negative.   Neurological: Negative for tremors and numbness.  Hematological: Negative for adenopathy. Does not bruise/bleed easily.  Psychiatric/Behavioral: Negative for behavioral problems and sleep disturbance. The patient is not nervous/anxious.     Physical Exam Vitals signs and  nursing note reviewed.  Constitutional:      General: She is not in acute distress.    Appearance: She is well-developed. She is not diaphoretic.  HENT:     Head: Normocephalic and atraumatic.     Mouth/Throat:     Pharynx: No oropharyngeal exudate.  Eyes:     Pupils: Pupils are equal, round, and reactive to light.  Neck:     Musculoskeletal: Normal range of motion and neck supple.     Thyroid: No thyromegaly.     Vascular: No JVD.     Trachea: No tracheal deviation.   Cardiovascular:     Rate and Rhythm: Normal rate and regular rhythm.     Heart sounds: Normal heart sounds. No murmur. No friction rub. No gallop.   Pulmonary:     Effort: Pulmonary effort is normal. No respiratory distress.     Breath sounds: Normal breath sounds. No wheezing or rales.  Chest:     Chest wall: No tenderness.  Abdominal:     Palpations: Abdomen is soft.     Tenderness: There is no abdominal tenderness. There is no guarding.  Musculoskeletal: Normal range of motion.  Lymphadenopathy:     Cervical: No cervical adenopathy.  Skin:    General: Skin is warm and dry.  Neurological:     Mental Status: She is alert and oriented to person, place, and time.     Cranial Nerves: No cranial nerve deficit.  Psychiatric:        Behavior: Behavior normal.        Thought Content: Thought content normal.        Judgment: Judgment normal.    Assessment/Plan: 1. Acute non-recurrent maxillary sinusitis Due to length of illness as well as some sinus pain and pressure.  Provided with course of azithromycin due to allergy to penicillin.  Instructed patient to take all medication as prescribed and if no improvement in symptoms in 7 to 10 days she should return to clinic. - azithromycin (ZITHROMAX) 250 MG tablet; Take as directed.  Dispense: 6 tablet; Refill: 0  2. Sore throat Strep negative today. - POCT rapid strep A  3. Essential hypertension, benign Patient's blood pressure slightly elevated today 158/78.  Likely due to illness.  We will can to monitor at future visits.  4. Gastroesophageal reflux disease with esophagitis Stable, continue present management.  General Counseling: monasia lair understanding of the findings of todays visit and agrees with plan of treatment. I have discussed any further diagnostic evaluation that may be needed or ordered today. We also reviewed her medications today. she has been encouraged to call the office with any questions or concerns that  should arise related to todays visit.   Orders Placed This Encounter  Procedures  . POCT rapid strep A    Meds ordered this encounter  Medications  . azithromycin (ZITHROMAX) 250 MG tablet    Sig: Take as directed.    Dispense:  6 tablet    Refill:  0    Time spent:25 Minutes  This patient was seen by Orson Gear AGNP-C in Collaboration with Dr Lavera Guise as a part of collaborative care agreement.  Kendell Bane AGNP-C Internal Medicine

## 2018-11-24 ENCOUNTER — Other Ambulatory Visit: Payer: Self-pay | Admitting: Internal Medicine

## 2018-11-24 ENCOUNTER — Other Ambulatory Visit: Payer: Self-pay | Admitting: Oncology

## 2018-11-24 DIAGNOSIS — E782 Mixed hyperlipidemia: Secondary | ICD-10-CM

## 2018-11-24 DIAGNOSIS — I10 Essential (primary) hypertension: Secondary | ICD-10-CM

## 2018-12-02 ENCOUNTER — Ambulatory Visit: Payer: Self-pay | Admitting: Internal Medicine

## 2018-12-04 ENCOUNTER — Ambulatory Visit (INDEPENDENT_AMBULATORY_CARE_PROVIDER_SITE_OTHER): Payer: BC Managed Care – PPO | Admitting: Internal Medicine

## 2018-12-04 ENCOUNTER — Encounter: Payer: Self-pay | Admitting: Internal Medicine

## 2018-12-04 DIAGNOSIS — K2100 Gastro-esophageal reflux disease with esophagitis, without bleeding: Secondary | ICD-10-CM

## 2018-12-04 DIAGNOSIS — D508 Other iron deficiency anemias: Secondary | ICD-10-CM | POA: Diagnosis not present

## 2018-12-04 DIAGNOSIS — Z0001 Encounter for general adult medical examination with abnormal findings: Secondary | ICD-10-CM | POA: Diagnosis not present

## 2018-12-04 DIAGNOSIS — R300 Dysuria: Secondary | ICD-10-CM | POA: Diagnosis not present

## 2018-12-04 DIAGNOSIS — E782 Mixed hyperlipidemia: Secondary | ICD-10-CM

## 2018-12-04 DIAGNOSIS — I10 Essential (primary) hypertension: Secondary | ICD-10-CM | POA: Diagnosis not present

## 2018-12-04 NOTE — Progress Notes (Signed)
[facility] [place], [place] [address]  Internal MEDICINE  Office Visit Note  Patient Name: Laurie Gross  [id number]  [id number]  Date of Service: 12/04/2018  Chief Complaint  Patient presents with  . Annual Exam  . Hypertension    HPI Pt is here for routine health maintenance examination. No new complaints today, does see gi and hematology for iron infusion  Current Medication: Outpatient Encounter Medications as of 12/04/2018  Medication Sig  . amLODipine (NORVASC) 10 MG tablet TAKE 1 TABLET(10 MG) BY MOUTH DAILY  . bacitracin 500 UNIT/GM ointment Apply 1 application topically 2 (two) times daily.  . Cholecalciferol (VITAMIN D-3) 5000 units TABS Take 5,000 Units by mouth.  . docusate sodium (COLACE) 100 MG capsule Take 100 mg by mouth 2 (two) times daily.  . folic acid (FOLVITE) 1 MG tablet TAKE 1 TABLET BY MOUTH EVERY DAY  . lisinopril (PRINIVIL,ZESTRIL) 40 MG tablet TAKE 1 TABLET(40 MG) BY MOUTH DAILY  . Multiple Vitamins-Iron (MULTIVITAMIN/IRON PO) Take 1 capsule by mouth daily.  . niacin (NIASPAN) 500 MG CR tablet TAKE 1 TABLET(500 MG) BY MOUTH AT BEDTIME  . pantoprazole (PROTONIX) 40 MG tablet Take 1 tablet (40 mg total) by mouth daily.  Marland Kitchen triamcinolone cream (KENALOG) 0.1 % Apply 1 application topically 2 (two) times daily.  . vitamin B-12 (CYANOCOBALAMIN) 1000 MCG tablet Take 1,000 mcg by mouth daily.  . [DISCONTINUED] azithromycin (ZITHROMAX) 250 MG tablet Take as directed. (Patient not taking: Reported on 12/04/2018)   No facility-administered encounter medications on file as of 12/04/2018.     Surgical History: Past Surgical History:  Procedure Laterality Date  . BREAST BIOPSY Right Age 33  . BREAST BIOPSY Left 07-21-12   Core biopsy showed atypical lobular hyperplasia and a 2 mm foci, atypical ductal hyperplasia and a 1 mm foci. There was no evidence of in situ or invasive cancer  . BREAST CYST ASPIRATION  August 2013   inconclusive,  with atypical ductal cells identified  . COLONOSCOPY  2002, 2005, 2009, 2012   polyps   . TOTAL ABDOMINAL HYSTERECTOMY W/ BILATERAL SALPINGOOPHORECTOMY  Age 42    Medical History: Past Medical History:  Diagnosis Date  . Anemia   . Breast screening, unspecified   . Eczema   . Family history of colonic polyps   . Lump or mass in breast   . Neoplasm of uncertain behavior of breast   . Personal history of colonic polyps   . Special screening for malignant neoplasms, colon   . Unspecified essential hypertension     Family History: Family History  Problem Relation Age of Onset  . Colon cancer Father   . Colon cancer Other        Paternal first cousin  . Hypertension Mother   . Anemia Mother       Review of Systems  Constitutional: Negative for chills, diaphoresis and fatigue.  HENT: Negative for ear pain, postnasal drip and sinus pressure.   Eyes: Negative for photophobia, discharge, redness, itching and visual disturbance.  Respiratory: Negative for cough, shortness of breath and wheezing.   Cardiovascular: Negative for chest pain, palpitations and leg swelling.  Gastrointestinal: Negative for abdominal pain, constipation, diarrhea, nausea and vomiting.  Genitourinary: Negative for dysuria and flank pain.  Musculoskeletal: Negative for arthralgias, back pain, gait problem and neck pain.  Skin: Negative for color change.  Allergic/Immunologic: Negative for environmental allergies and food allergies.  Neurological: Negative for dizziness and headaches.  Hematological: Does  not bruise/bleed easily.  Psychiatric/Behavioral: Negative for agitation, behavioral problems (depression) and hallucinations.     Vital Signs: BP (!) 160/80   Pulse 68   Resp 16   Ht 5\' 7"  (1.702 m)   Wt 158 lb (71.7 kg)   SpO2 98%   BMI 24.75 kg/m    Physical Exam Constitutional:      General: She is not in acute distress.    Appearance: She is well-developed. She is not diaphoretic.   HENT:     Head: Normocephalic and atraumatic.     Mouth/Throat:     Pharynx: No oropharyngeal exudate.  Eyes:     Pupils: Pupils are equal, round, and reactive to light.  Neck:     Musculoskeletal: Normal range of motion and neck supple.     Thyroid: No thyromegaly.     Vascular: No JVD.     Trachea: No tracheal deviation.  Cardiovascular:     Rate and Rhythm: Normal rate and regular rhythm.     Heart sounds: Normal heart sounds. No murmur. No friction rub. No gallop.   Pulmonary:     Effort: Pulmonary effort is normal. No respiratory distress.     Breath sounds: No wheezing or rales.  Chest:     Chest wall: No tenderness.     Breasts:        Right: Normal. No swelling.        Left: Normal. No swelling.  Abdominal:     General: Bowel sounds are normal.     Palpations: Abdomen is soft.  Musculoskeletal: Normal range of motion.  Lymphadenopathy:     Cervical: No cervical adenopathy.  Skin:    General: Skin is warm and dry.  Neurological:     Mental Status: She is alert and oriented to person, place, and time.     Cranial Nerves: No cranial nerve deficit.  Psychiatric:        Behavior: Behavior normal.        Thought Content: Thought content normal.        Judgment: Judgment normal.    LABS: Recent Results (from the past 2160 hour(s))  Vitamin B12     Status: Abnormal   Collection Time: 10/20/18  9:16 AM  Result Value Ref Range   Vitamin B-12 1,177 (H) 180 - 914 pg/mL    Comment: (NOTE) This assay is not validated for testing neonatal or myeloproliferative syndrome specimens for Vitamin B12 levels. Performed at [facility], [place] [address], [place], [place] [address]   Ferritin     Status: Abnormal   Collection Time: 10/20/18  9:16 AM  Result Value Ref Range   Ferritin 479 (H) 11 - 307 ng/mL    Comment: Performed at [facility], [address] [place], [place] [address]  CBC with Differential     Status: Abnormal   Collection Time: 10/20/18   9:16 AM  Result Value Ref Range   WBC 4.3 4.0 - 10.5 K/uL   RBC 4.24 3.87 - 5.11 MIL/uL   Hemoglobin 10.3 (L) 12.0 - 15.0 g/dL    Comment: Reticulocyte Hemoglobin testing may be clinically indicated, consider ordering this additional test [id number]    HCT 32.8 (L) 36.0 - 46.0 %   MCV 77.4 (L) 80.0 - 100.0 fL   MCH 24.3 (L) 26.0 - 34.0 pg   MCHC 31.4 30.0 - 36.0 g/dL   RDW 14.8 11.5 - 15.5 %   Platelets 243 150 - 400 K/uL  nRBC 0.0 0.0 - 0.2 %   Neutrophils Relative % 48 %   Neutro Abs 2.1 1.7 - 7.7 K/uL   Lymphocytes Relative 44 %   Lymphs Abs 1.9 0.7 - 4.0 K/uL   Monocytes Relative 5 %   Monocytes Absolute 0.2 0.1 - 1.0 K/uL   Eosinophils Relative 2 %   Eosinophils Absolute 0.1 0.0 - 0.5 K/uL   Basophils Relative 1 %   Basophils Absolute 0.0 0.0 - 0.1 K/uL   Immature Granulocytes 0 %   Abs Immature Granulocytes 0.01 0.00 - 0.07 K/uL    Comment: Performed at [facility], [address] [place], [place] [address]  POCT rapid strep A     Status: None   Collection Time: 11/13/18  2:23 PM  Result Value Ref Range   Rapid Strep A Screen Negative Negative   Assessment/Plan:   General Counseling: Hanin verbalizes understanding of the findings of todays visit and agrees with plan of treatment. I have discussed any further diagnostic evaluation that may be needed or ordered today. We also reviewed her medications today. she has been encouraged to call the office with any questions or concerns that should arise related to todays visit.  Counseling: Cardiac risk factor modification:  1. Control blood pressure. 2. Exercise as prescribed. 3. Follow low sodium, low fat diet. and low fat and low cholestrol diet. 4. Take ASA 81mg  once a day. 5. Restricted calories diet to lose weight.   Orders Placed This Encounter  Procedures  . UA/M w/rflx Culture, Routine  . Lipid Panel With LDL/HDL Ratio  . Comprehensive metabolic panel     Time PFXTK:24 [place],  MD  Internal Medicine

## 2018-12-04 NOTE — Progress Notes (Signed)
Pt Bp was high 160/80 and at home 117/70 at this morning

## 2018-12-05 DIAGNOSIS — E782 Mixed hyperlipidemia: Secondary | ICD-10-CM | POA: Diagnosis not present

## 2018-12-06 LAB — LIPID PANEL WITH LDL/HDL RATIO
Cholesterol, Total: 225 mg/dL — ABNORMAL HIGH (ref 100–199)
HDL: 52 mg/dL (ref >39)
Triglycerides: 582 mg/dL (ref 0–149)

## 2018-12-07 LAB — MICROSCOPIC EXAMINATION
Bacteria, UA: NONE SEEN
Casts: NONE SEEN /lpf

## 2018-12-07 LAB — URINE CULTURE, REFLEX

## 2018-12-07 LAB — UA/M W/RFLX CULTURE, ROUTINE
Bilirubin, UA: NEGATIVE
GLUCOSE, UA: NEGATIVE
Ketones, UA: NEGATIVE
Nitrite, UA: NEGATIVE
Protein, UA: NEGATIVE
RBC, UA: NEGATIVE
Specific Gravity, UA: 1.016 (ref 1.005–1.030)
UUROB: 0.2 mg/dL (ref 0.2–1.0)
pH, UA: 6 (ref 5.0–7.5)

## 2018-12-09 DIAGNOSIS — M79672 Pain in left foot: Secondary | ICD-10-CM | POA: Diagnosis not present

## 2018-12-09 DIAGNOSIS — M216X1 Other acquired deformities of right foot: Secondary | ICD-10-CM | POA: Diagnosis not present

## 2018-12-09 DIAGNOSIS — M21072 Valgus deformity, not elsewhere classified, left ankle: Secondary | ICD-10-CM | POA: Diagnosis not present

## 2018-12-09 DIAGNOSIS — M79671 Pain in right foot: Secondary | ICD-10-CM | POA: Diagnosis not present

## 2018-12-09 DIAGNOSIS — M21071 Valgus deformity, not elsewhere classified, right ankle: Secondary | ICD-10-CM | POA: Diagnosis not present

## 2018-12-17 ENCOUNTER — Other Ambulatory Visit: Payer: Self-pay | Admitting: *Deleted

## 2018-12-17 MED ORDER — FOLIC ACID 1 MG PO TABS: 1.0000 mg | ORAL_TABLET | Freq: Every day | ORAL | 1 refills | Status: DC

## 2018-12-20 IMAGING — CR DG CHEST 2V
1 series · 2 of 2 positions shown · non-contrast
Comparison: Chest x-ray of February 17, 2014

CLINICAL DATA: Status post fall 2 weeks ago with persistent
right-sided ribcage pain and pleuritic chest discomfort.

EXAM:
CHEST  2 VIEW

[Series 1: dg chest 2 view · 0.14mm/px · 2 of 2 slices shown]
[im 1/2]
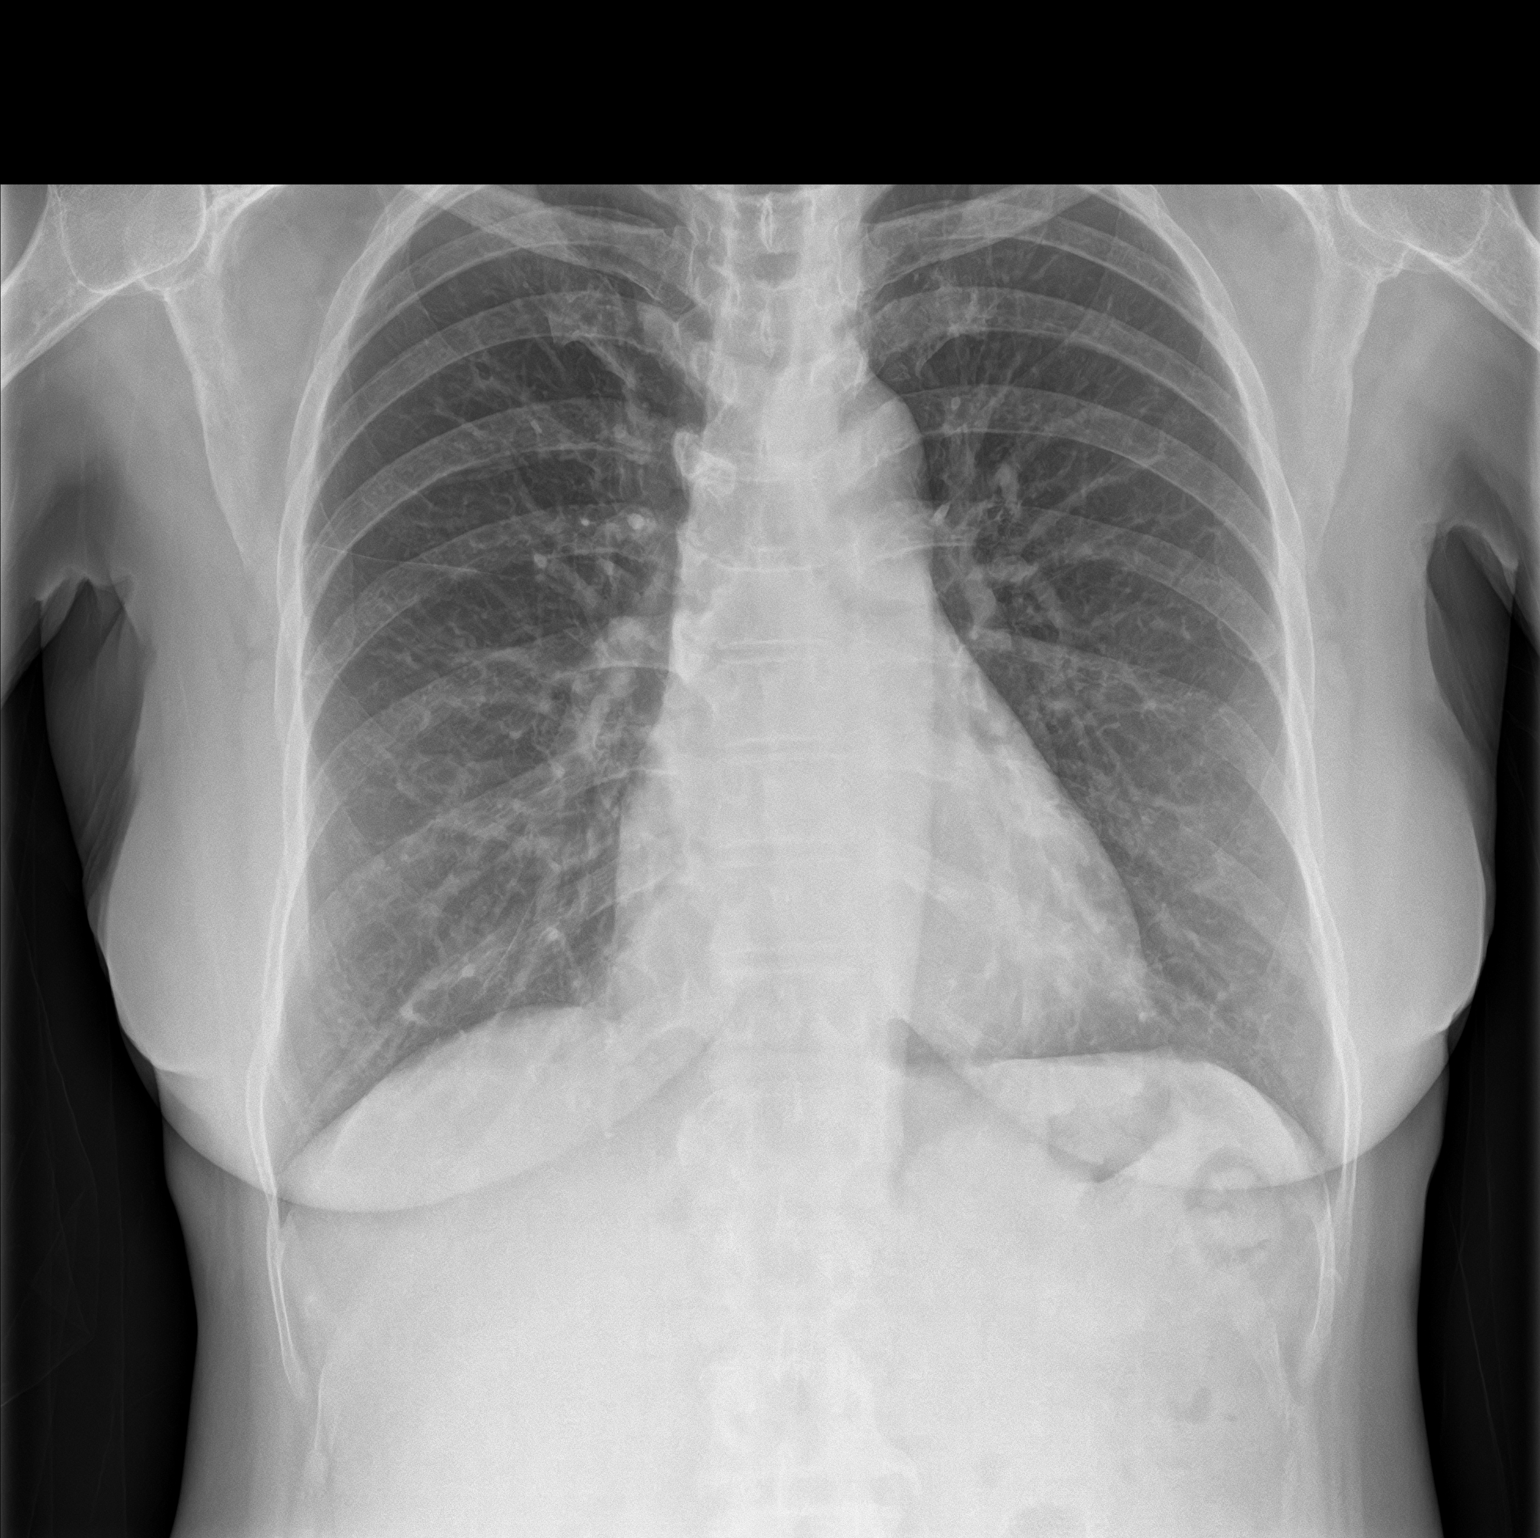
[im 2/2]
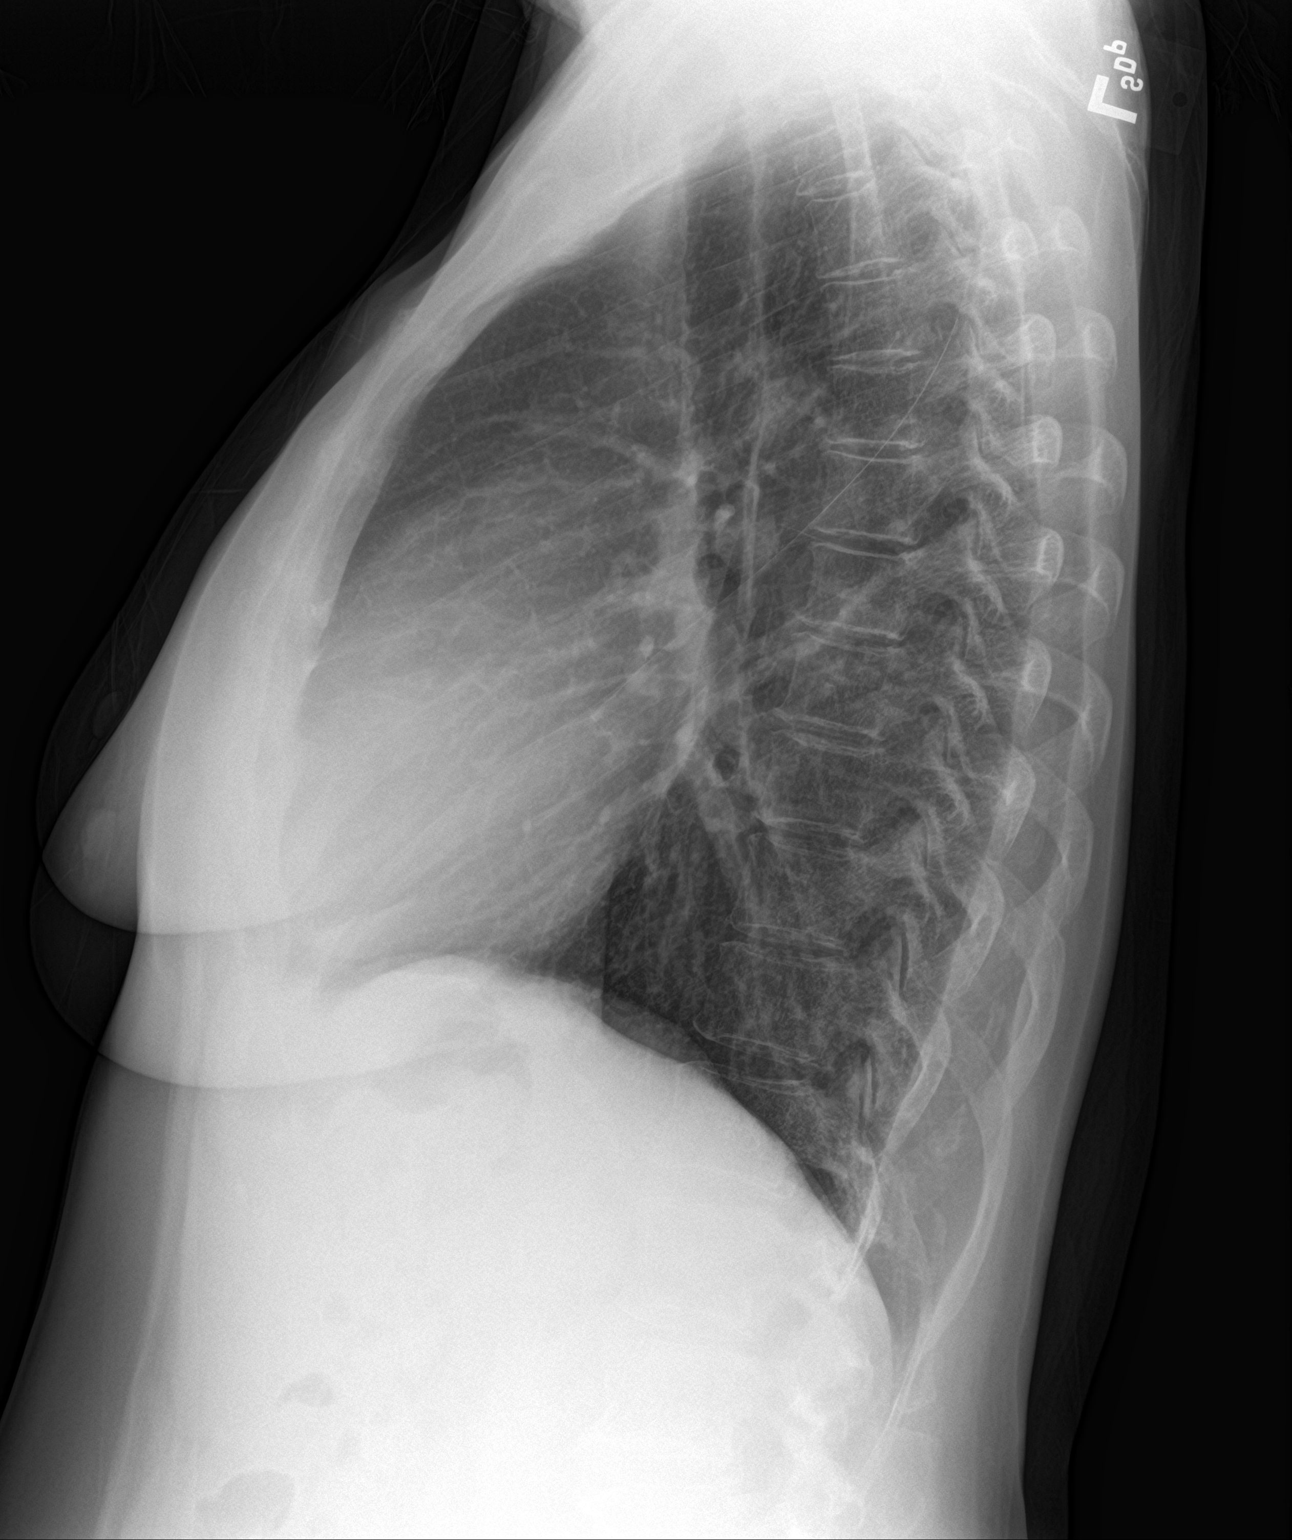

[2 of 2 positions shown; findings below may reference images not displayed]

FINDINGS: The lungs are well-expanded. The interstitial markings are mildly
prominent but this is not entirely new. There is no alveolar
infiltrate, pleural effusion, or pneumothorax. The heart and
pulmonary vascularity are normal. The mediastinum is normal in
width. The observed bony thorax is unremarkable. Specific attention
to the left ribcage reveals no abnormality where visualized.
IMPRESSION: Mild chronic bronchitic changes. No acute post traumatic changes are
observed. If at occult rib fractures strongly suspected, a right rib
series would be a useful next imaging step.

## 2018-12-22 ENCOUNTER — Telehealth: Payer: Self-pay

## 2018-12-22 NOTE — Progress Notes (Incomplete)
[facility] [place], [place] [address]  Internal MEDICINE  Office Visit Note  Patient Name: Laurie Gross  [id number]  [id number]  Date of Service: 12/04/2018  Chief Complaint  Patient presents with  . Annual Exam  . Hypertension    HPI Pt is here for routine health maintenance examination. No new complaints today, does see gi and hematology for iron infusion  Current Medication: Outpatient Encounter Medications as of 12/04/2018  Medication Sig  . amLODipine (NORVASC) 10 MG tablet TAKE 1 TABLET(10 MG) BY MOUTH DAILY  . bacitracin 500 UNIT/GM ointment Apply 1 application topically 2 (two) times daily.  . Cholecalciferol (VITAMIN D-3) 5000 units TABS Take 5,000 Units by mouth.  . docusate sodium (COLACE) 100 MG capsule Take 100 mg by mouth 2 (two) times daily.  . folic acid (FOLVITE) 1 MG tablet TAKE 1 TABLET BY MOUTH EVERY DAY  . lisinopril (PRINIVIL,ZESTRIL) 40 MG tablet TAKE 1 TABLET(40 MG) BY MOUTH DAILY  . Multiple Vitamins-Iron (MULTIVITAMIN/IRON PO) Take 1 capsule by mouth daily.  . niacin (NIASPAN) 500 MG CR tablet TAKE 1 TABLET(500 MG) BY MOUTH AT BEDTIME  . pantoprazole (PROTONIX) 40 MG tablet Take 1 tablet (40 mg total) by mouth daily.  Marland Kitchen triamcinolone cream (KENALOG) 0.1 % Apply 1 application topically 2 (two) times daily.  . vitamin B-12 (CYANOCOBALAMIN) 1000 MCG tablet Take 1,000 mcg by mouth daily.  . [DISCONTINUED] azithromycin (ZITHROMAX) 250 MG tablet Take as directed. (Patient not taking: Reported on 12/04/2018)   No facility-administered encounter medications on file as of 12/04/2018.     Surgical History: Past Surgical History:  Procedure Laterality Date  . BREAST BIOPSY Right Age 55  . BREAST BIOPSY Left 07-21-12   Core biopsy showed atypical lobular hyperplasia and a 2 mm foci, atypical ductal hyperplasia and a 1 mm foci. There was no evidence of in situ or invasive cancer  . BREAST CYST ASPIRATION  August 2013   inconclusive,  with atypical ductal cells identified  . COLONOSCOPY  2002, 2005, 2009, 2012   polyps   . TOTAL ABDOMINAL HYSTERECTOMY W/ BILATERAL SALPINGOOPHORECTOMY  Age 36    Medical History: Past Medical History:  Diagnosis Date  . Anemia   . Breast screening, unspecified   . Eczema   . Family history of colonic polyps   . Lump or mass in breast   . Neoplasm of uncertain behavior of breast   . Personal history of colonic polyps   . Special screening for malignant neoplasms, colon   . Unspecified essential hypertension     Family History: Family History  Problem Relation Age of Onset  . Colon cancer Father   . Colon cancer Other        Paternal first cousin  . Hypertension Mother   . Anemia Mother       Review of Systems  Constitutional: Negative for chills, diaphoresis and fatigue.  HENT: Negative for ear pain, postnasal drip and sinus pressure.   Eyes: Negative for photophobia, discharge, redness, itching and visual disturbance.  Respiratory: Negative for cough, shortness of breath and wheezing.   Cardiovascular: Negative for chest pain, palpitations and leg swelling.  Gastrointestinal: Negative for abdominal pain, constipation, diarrhea, nausea and vomiting.  Genitourinary: Negative for dysuria and flank pain.  Musculoskeletal: Negative for arthralgias, back pain, gait problem and neck pain.  Skin: Negative for color change.  Allergic/Immunologic: Negative for environmental allergies and food allergies.  Neurological: Negative for dizziness and headaches.  Hematological: Does  not bruise/bleed easily.  Psychiatric/Behavioral: Negative for agitation, behavioral problems (depression) and hallucinations.     Vital Signs: BP (!) 160/80   Pulse 68   Resp 16   Ht 5\' 7"  (1.702 m)   Wt 158 lb (71.7 kg)   SpO2 98%   BMI 24.75 kg/m    Physical Exam Constitutional:      General: She is not in acute distress.    Appearance: She is well-developed. She is not diaphoretic.   HENT:     Head: Normocephalic and atraumatic.     Mouth/Throat:     Pharynx: No oropharyngeal exudate.  Eyes:     Pupils: Pupils are equal, round, and reactive to light.  Neck:     Musculoskeletal: Normal range of motion and neck supple.     Thyroid: No thyromegaly.     Vascular: No JVD.     Trachea: No tracheal deviation.  Cardiovascular:     Rate and Rhythm: Normal rate and regular rhythm.     Heart sounds: Normal heart sounds. No murmur. No friction rub. No gallop.   Pulmonary:     Effort: Pulmonary effort is normal. No respiratory distress.     Breath sounds: No wheezing or rales.  Chest:     Chest wall: No tenderness.     Breasts:        Right: Normal. No swelling.        Left: Normal. No swelling.  Abdominal:     General: Bowel sounds are normal.     Palpations: Abdomen is soft.  Musculoskeletal: Normal range of motion.  Lymphadenopathy:     Cervical: No cervical adenopathy.  Skin:    General: Skin is warm and dry.  Neurological:     Mental Status: She is alert and oriented to person, place, and time.     Cranial Nerves: No cranial nerve deficit.  Psychiatric:        Behavior: Behavior normal.        Thought Content: Thought content normal.        Judgment: Judgment normal.    LABS: Recent Results (from the past 2160 hour(s))  Vitamin B12     Status: Abnormal   Collection Time: 10/20/18  9:16 AM  Result Value Ref Range   Vitamin B-12 1,177 (H) 180 - 914 pg/mL    Comment: (NOTE) This assay is not validated for testing neonatal or myeloproliferative syndrome specimens for Vitamin B12 levels. Performed at [facility], [place] [address], [place], [place] [address]   Ferritin     Status: Abnormal   Collection Time: 10/20/18  9:16 AM  Result Value Ref Range   Ferritin 479 (H) 11 - 307 ng/mL    Comment: Performed at [facility], [address] [place], [place] [address]  CBC with Differential     Status: Abnormal   Collection Time: 10/20/18   9:16 AM  Result Value Ref Range   WBC 4.3 4.0 - 10.5 K/uL   RBC 4.24 3.87 - 5.11 MIL/uL   Hemoglobin 10.3 (L) 12.0 - 15.0 g/dL    Comment: Reticulocyte Hemoglobin testing may be clinically indicated, consider ordering this additional test [id number]    HCT 32.8 (L) 36.0 - 46.0 %   MCV 77.4 (L) 80.0 - 100.0 fL   MCH 24.3 (L) 26.0 - 34.0 pg   MCHC 31.4 30.0 - 36.0 g/dL   RDW 14.8 11.5 - 15.5 %   Platelets 243 150 - 400 K/uL  nRBC 0.0 0.0 - 0.2 %   Neutrophils Relative % 48 %   Neutro Abs 2.1 1.7 - 7.7 K/uL   Lymphocytes Relative 44 %   Lymphs Abs 1.9 0.7 - 4.0 K/uL   Monocytes Relative 5 %   Monocytes Absolute 0.2 0.1 - 1.0 K/uL   Eosinophils Relative 2 %   Eosinophils Absolute 0.1 0.0 - 0.5 K/uL   Basophils Relative 1 %   Basophils Absolute 0.0 0.0 - 0.1 K/uL   Immature Granulocytes 0 %   Abs Immature Granulocytes 0.01 0.00 - 0.07 K/uL    Comment: Performed at [facility], [address] [place], [place] [address]  POCT rapid strep A     Status: None   Collection Time: 11/13/18  2:23 PM  Result Value Ref Range   Rapid Strep A Screen Negative Negative   Assessment/Plan: 1. Encounter for general adult medical examination with abnormal findings - Pt is current on her preventive health maintenance  2. Essential hypertension, benign - Continue all meds as before  - Comprehensive metabolic panel; Future  3. Gastroesophageal reflux disease with esophagitis - Controlled   4. Iron deficiency anemia secondary to inadequate dietary iron intake - Stable   5. Mixed hyperlipidemia - Diet controlled  - Lipid Panel With LDL/HDL Ratio  6. Dysuria - UA/M w/rflx Culture, Routine  General Counseling: Janiyah verbalizes understanding of the findings of todays visit and agrees with plan of treatment. I have discussed any further diagnostic evaluation that may be needed or ordered today. We also reviewed her medications today. she has been encouraged to call the office with any  questions or concerns that should arise related to todays visit.  Counseling: Cardiac risk factor modification:  1. Control blood pressure. 2. Exercise as prescribed. 3. Follow low sodium, low fat diet. and low fat and low cholestrol diet. 4. Take ASA 81mg  once a day. 5. Restricted calories diet to lose weight.   Orders Placed This Encounter  Procedures  . UA/M w/rflx Culture, Routine  . Lipid Panel With LDL/HDL Ratio  . Comprehensive metabolic panel     Time FHLKT:62 [place], MD  Internal Medicine

## 2018-12-22 NOTE — Telephone Encounter (Signed)
Spoke with we going to mailed low diet and make appt  In 8 weeks

## 2018-12-22 NOTE — Telephone Encounter (Signed)
-----   Message from Lavera Guise, MD sent at 12/22/2018 10:39 AM EDT ----- Regarding: LABS Please mail her low fat and low cholesterol diet for high cholestrol, however I will like to see her in 8 weeks if possible

## 2018-12-29 ENCOUNTER — Telehealth: Payer: Self-pay

## 2018-12-29 NOTE — Telephone Encounter (Signed)
Pt called today requesting her lab results, Mailed lab results and prudent diet to patient.

## 2019-02-17 ENCOUNTER — Ambulatory Visit: Payer: BLUE CROSS/BLUE SHIELD | Admitting: Internal Medicine

## 2019-02-23 ENCOUNTER — Other Ambulatory Visit: Payer: Self-pay | Admitting: Oncology

## 2019-02-24 ENCOUNTER — Ambulatory Visit: Payer: Self-pay | Admitting: Internal Medicine

## 2019-03-11 DIAGNOSIS — H5203 Hypermetropia, bilateral: Secondary | ICD-10-CM | POA: Diagnosis not present

## 2019-04-07 ENCOUNTER — Other Ambulatory Visit: Payer: Self-pay

## 2019-04-07 ENCOUNTER — Ambulatory Visit: Payer: BC Managed Care – PPO | Admitting: Internal Medicine

## 2019-04-07 ENCOUNTER — Encounter: Payer: Self-pay | Admitting: Internal Medicine

## 2019-04-07 VITALS — BP 140/72 | HR 120 | Temp 97.2°F | Resp 16

## 2019-04-07 DIAGNOSIS — R000 Tachycardia, unspecified: Secondary | ICD-10-CM

## 2019-04-07 DIAGNOSIS — I10 Essential (primary) hypertension: Secondary | ICD-10-CM | POA: Diagnosis not present

## 2019-04-07 DIAGNOSIS — E782 Mixed hyperlipidemia: Secondary | ICD-10-CM

## 2019-04-07 NOTE — Progress Notes (Signed)
[facility] [place], [place] [address]  Internal MEDICINE  Telephone Visit  Patient Name: Laurie Gross  [id number]  [id number]  Date of Service: 04/07/2019 I connected with the patient at 1:08 by telephone and verified the patients identity using two identifiers.   I discussed the limitations, risks, security and privacy concerns of performing an evaluation and management service by telephone and the availability of in person appointments. I also discussed with the patient that there may be a patient responsible charge related to the service.  The patient expressed understanding and agrees to proceed.    Chief Complaint  Patient presents with  . Medical Management of Chronic Issues    8 week follow up review labs ,pulse high,pt stated that she has been on the go with activity in the home   HPI Pt is connected via phone to prevent risk of pandemic of covid-19. She feels well, bp is under good control her pulse is elevated when she took it herself. Denies any c/p or sob or palpations, She has been on Niacin, TG are worse than before, all other labs are normal   Current Medication: Outpatient Encounter Medications as of 04/07/2019  Medication Sig  . amLODipine (NORVASC) 10 MG tablet TAKE 1 TABLET(10 MG) BY MOUTH DAILY  . bacitracin 500 UNIT/GM ointment Apply 1 application topically 2 (two) times daily.  . Cholecalciferol (VITAMIN D-3) 5000 units TABS Take 5,000 Units by mouth.  . docusate sodium (COLACE) 100 MG capsule Take 100 mg by mouth 2 (two) times daily.  . folic acid (FOLVITE) 1 MG tablet TAKE 1 TABLET(1 MG) BY MOUTH DAILY  . lisinopril (PRINIVIL,ZESTRIL) 40 MG tablet TAKE 1 TABLET(40 MG) BY MOUTH DAILY  . Multiple Vitamins-Iron (MULTIVITAMIN/IRON PO) Take 1 capsule by mouth daily.  . niacin (NIASPAN) 500 MG CR tablet TAKE 1 TABLET(500 MG) BY MOUTH AT BEDTIME  . pantoprazole (PROTONIX) 40 MG tablet Take 1 tablet (40 mg total) by mouth daily.  Marland Kitchen triamcinolone  cream (KENALOG) 0.1 % Apply 1 application topically 2 (two) times daily.  . vitamin B-12 (CYANOCOBALAMIN) 1000 MCG tablet Take 1,000 mcg by mouth daily.   No facility-administered encounter medications on file as of 04/07/2019.    Surgical History: Past Surgical History:  Procedure Laterality Date  . BREAST BIOPSY Right Age 30  . BREAST BIOPSY Left 07-21-12   Core biopsy showed atypical lobular hyperplasia and a 2 mm foci, atypical ductal hyperplasia and a 1 mm foci. There was no evidence of in situ or invasive cancer  . BREAST CYST ASPIRATION  August 2013   inconclusive, with atypical ductal cells identified  . COLONOSCOPY  2002, 2005, 2009, 2012   polyps   . TOTAL ABDOMINAL HYSTERECTOMY W/ BILATERAL SALPINGOOPHORECTOMY  Age 79   Medical History: Past Medical History:  Diagnosis Date  . Anemia   . Breast screening, unspecified   . Eczema   . Family history of colonic polyps   . Lump or mass in breast   . Neoplasm of uncertain behavior of breast   . Personal history of colonic polyps   . Special screening for malignant neoplasms, colon   . Unspecified essential hypertension     Family History: Family History  Problem Relation Age of Onset  . Colon cancer Father   . Colon cancer Other        Paternal first cousin  . Hypertension Mother   . Anemia Mother    Social History   Socioeconomic History  .  Marital status: Married    Spouse name: Not on file  . Number of children: Not on file  . Years of education: Not on file  . Highest education level: Not on file  Occupational History  . Not on file  Social Needs  . Financial resource strain: Not on file  . Food insecurity    Worry: Not on file    Inability: Not on file  . Transportation needs    Medical: Not on file    Non-medical: Not on file  Tobacco Use  . Smoking status: Never Smoker  . Smokeless tobacco: Never Used  Substance and Sexual Activity  . Alcohol use: Yes    Comment: occassionally  . Drug use: No   . Sexual activity: Not on file  Lifestyle  . Physical activity    Days per week: Not on file    Minutes per session: Not on file  . Stress: Not on file  Relationships  . Social Herbalist on phone: Not on file    Gets together: Not on file    Attends religious service: Not on file    Active member of club or organization: Not on file    Attends meetings of clubs or organizations: Not on file    Relationship status: Not on file  . Intimate partner violence    Fear of current or ex partner: Not on file    Emotionally abused: Not on file    Physically abused: Not on file    Forced sexual activity: Not on file  Other Topics Concern  . Not on file  Social History Narrative  . Not on file   Review of Systems  Constitutional: Negative.   HENT: Negative.   Respiratory: Negative.  Negative for chest tightness.   Cardiovascular: Negative for chest pain.  Gastrointestinal: Negative.  Negative for abdominal distention.  Genitourinary: Negative.   Hematological: Negative.    Vital Signs: BP 140/72   Pulse (!) 120   Temp (!) 97.2 F (36.2 C)   Resp 16   Observation/Objective: Connected by phone, NAD, communicates well  Assessment/Plan: 1. Essential hypertension, benign Controlled with meds.  2. Mixed hyperlipidemia Continue Niacin as before, repeat lipid profile next visit, might need therapy for TG  3. Tachycardia, unspecified Was instructed to monitor at home   General Counseling: tamorah hada understanding of the findings of today's phone visit and agrees with plan of treatment. I have discussed any further diagnostic evaluation that may be needed or ordered today. We also reviewed her medications today. she has been encouraged to call the office with any questions or concerns that should arise related to todays visit.  Time spent:20 Minutes  Dr Lavera Guise Internal medicine

## 2019-04-20 ENCOUNTER — Ambulatory Visit: Payer: BLUE CROSS/BLUE SHIELD | Admitting: Oncology

## 2019-04-20 ENCOUNTER — Other Ambulatory Visit: Payer: BLUE CROSS/BLUE SHIELD

## 2019-04-28 ENCOUNTER — Other Ambulatory Visit: Payer: Self-pay

## 2019-04-28 DIAGNOSIS — E538 Deficiency of other specified B group vitamins: Secondary | ICD-10-CM

## 2019-04-28 DIAGNOSIS — D649 Anemia, unspecified: Secondary | ICD-10-CM

## 2019-04-30 ENCOUNTER — Other Ambulatory Visit: Payer: Self-pay

## 2019-04-30 ENCOUNTER — Encounter: Payer: Self-pay | Admitting: Oncology

## 2019-04-30 ENCOUNTER — Other Ambulatory Visit: Payer: Self-pay | Admitting: *Deleted

## 2019-04-30 ENCOUNTER — Inpatient Hospital Stay (HOSPITAL_BASED_OUTPATIENT_CLINIC_OR_DEPARTMENT_OTHER): Payer: BC Managed Care – PPO | Admitting: Oncology

## 2019-04-30 ENCOUNTER — Inpatient Hospital Stay: Payer: BC Managed Care – PPO | Attending: Oncology

## 2019-04-30 VITALS — BP 137/71 | HR 98 | Temp 97.6°F | Resp 18 | Wt 154.2 lb

## 2019-04-30 DIAGNOSIS — Z880 Allergy status to penicillin: Secondary | ICD-10-CM | POA: Diagnosis not present

## 2019-04-30 DIAGNOSIS — R7989 Other specified abnormal findings of blood chemistry: Secondary | ICD-10-CM

## 2019-04-30 DIAGNOSIS — K219 Gastro-esophageal reflux disease without esophagitis: Secondary | ICD-10-CM | POA: Diagnosis not present

## 2019-04-30 DIAGNOSIS — D649 Anemia, unspecified: Secondary | ICD-10-CM

## 2019-04-30 DIAGNOSIS — N63 Unspecified lump in unspecified breast: Secondary | ICD-10-CM | POA: Insufficient documentation

## 2019-04-30 DIAGNOSIS — Z8719 Personal history of other diseases of the digestive system: Secondary | ICD-10-CM | POA: Insufficient documentation

## 2019-04-30 DIAGNOSIS — E785 Hyperlipidemia, unspecified: Secondary | ICD-10-CM

## 2019-04-30 DIAGNOSIS — E538 Deficiency of other specified B group vitamins: Secondary | ICD-10-CM | POA: Diagnosis not present

## 2019-04-30 DIAGNOSIS — I10 Essential (primary) hypertension: Secondary | ICD-10-CM

## 2019-04-30 DIAGNOSIS — Z832 Family history of diseases of the blood and blood-forming organs and certain disorders involving the immune mechanism: Secondary | ICD-10-CM | POA: Diagnosis not present

## 2019-04-30 DIAGNOSIS — Z885 Allergy status to narcotic agent status: Secondary | ICD-10-CM | POA: Diagnosis not present

## 2019-04-30 DIAGNOSIS — Z800 Family history of malignant neoplasm of digestive organs: Secondary | ICD-10-CM

## 2019-04-30 DIAGNOSIS — Z881 Allergy status to other antibiotic agents status: Secondary | ICD-10-CM

## 2019-04-30 DIAGNOSIS — Z79899 Other long term (current) drug therapy: Secondary | ICD-10-CM

## 2019-04-30 DIAGNOSIS — Z888 Allergy status to other drugs, medicaments and biological substances status: Secondary | ICD-10-CM | POA: Insufficient documentation

## 2019-04-30 DIAGNOSIS — Z8249 Family history of ischemic heart disease and other diseases of the circulatory system: Secondary | ICD-10-CM | POA: Diagnosis not present

## 2019-04-30 LAB — CBC WITH DIFFERENTIAL/PLATELET
Abs Immature Granulocytes: 0.01 10*3/uL (ref 0.00–0.07)
Basophils Absolute: 0 10*3/uL (ref 0.0–0.1)
Basophils Relative: 1 %
Eosinophils Absolute: 0.1 10*3/uL (ref 0.0–0.5)
Eosinophils Relative: 1 %
HCT: 28.2 % — ABNORMAL LOW (ref 36.0–46.0)
Hemoglobin: 9.2 g/dL — ABNORMAL LOW (ref 12.0–15.0)
Immature Granulocytes: 0 %
Lymphocytes Relative: 32 %
Lymphs Abs: 1.4 10*3/uL (ref 0.7–4.0)
MCH: 26.5 pg (ref 26.0–34.0)
MCHC: 32.6 g/dL (ref 30.0–36.0)
MCV: 81.3 fL (ref 80.0–100.0)
Monocytes Absolute: 0.3 10*3/uL (ref 0.1–1.0)
Monocytes Relative: 6 %
Neutro Abs: 2.6 10*3/uL (ref 1.7–7.7)
Neutrophils Relative %: 60 %
Platelets: 271 10*3/uL (ref 150–400)
RBC: 3.47 MIL/uL — ABNORMAL LOW (ref 3.87–5.11)
RDW: 14.9 % (ref 11.5–15.5)
WBC: 4.4 10*3/uL (ref 4.0–10.5)
nRBC: 0.5 % — ABNORMAL HIGH (ref 0.0–0.2)

## 2019-04-30 LAB — FERRITIN: Ferritin: 461 ng/mL — ABNORMAL HIGH (ref 11–307)

## 2019-04-30 LAB — IRON AND TIBC
Iron: 107 ug/dL (ref 28–170)
Saturation Ratios: 36 % — ABNORMAL HIGH (ref 10.4–31.8)
TIBC: 298 ug/dL (ref 250–450)
UIBC: 191 ug/dL

## 2019-04-30 LAB — VITAMIN B12: Vitamin B-12: 3374 pg/mL — ABNORMAL HIGH (ref 180–914)

## 2019-04-30 LAB — FOLATE: Folate: 26 ng/mL (ref >5.9)

## 2019-04-30 NOTE — Progress Notes (Signed)
Patient is here for follow up, she is doing well no complaints.  

## 2019-05-02 NOTE — Progress Notes (Signed)
Hematology/Oncology Consult note [facility]  Telephone:([phone] [phone]  Patient Care Team: Lavera Guise, MD as PCP - General (Internal Medicine) Bary Castilla Forest Gleason, MD (General Surgery)   Name of the patient: Laurie Gross  [id number]  07-26-1956   Date of visit: 05/02/19  Diagnosis- normocytic anemia of unclear etiology  Chief complaint/ Reason for visit- routine f/u of anemia  Heme/Onc history: patient is a 63 year old African-American female with a past medical history significant for hypertension, hyperlipidemia and GERD among other medical problems. She has been referred to [place] for evaluation and management of anemia as well as elevated ferritin.  On review of outside labs patient has had chronic anemia and her hemoglobin has been between 9-10 since 2015. And her MCV ranges between 74-80. Hemoglobin electrophoresis in the past has been normal. Most recent CBC from 11/06/2017 showed white count of 4.2, H&H of 8.9/28.2 with an MCV of 84 and a platelet count of 314. TSH was within normal limits. CMP was within normal limits. Recent iron studies from 11/06/2017 showed elevated ferritin of 610. Iron saturation, serum iron and TIBC were normal. Of note patient has had an elevated ferritin since July 2017 that has been gradually increasing from 335 11/04/1966 to 662 in December 2018. Transferrin saturation has always been less than 35%. LFTs have always been normal.  Results of blood work from 12/09/2017 were as follows: CBC showed white count of 5.5, H&H of 10.1/30.7 with an MCV of 82.1 and a platelet count of 356. Differential was normal. CMP was essentially normal except for a mildly low potassium of 3.3 and elevated blood sugar of 150. LFTs and creatinine was normal serum calcium was normal. Ferritin levels were mildly elevated at 443 lowercompared to a prior value of 610. Iron studies were within normal limits. Folate was mildly low  at 5.3. B12 levels were low at 192. ESR was normal and ANA comprehensive panel was negative. Multiple myeloma panel revealed no monoclonal protein. Polyclonal gammopathy was noted. Serum free light chains revealed mildly elevated kappa of 24 with a normal kappa lambda ratio 1.2. Reticulocyte count was low normal at 1.8.  Interval history- she is doing well overall. Denies any complaints at this time  ECOG PS- 0 Pain scale- 0   Review of systems- Review of Systems  Constitutional: Negative for chills, fever, malaise/fatigue and weight loss.  HENT: Negative for congestion, ear discharge and nosebleeds.   Eyes: Negative for blurred vision.  Respiratory: Negative for cough, hemoptysis, sputum production, shortness of breath and wheezing.   Cardiovascular: Negative for chest pain, palpitations, orthopnea and claudication.  Gastrointestinal: Negative for abdominal pain, blood in stool, constipation, diarrhea, heartburn, melena, nausea and vomiting.  Genitourinary: Negative for dysuria, flank pain, frequency, hematuria and urgency.  Musculoskeletal: Negative for back pain, joint pain and myalgias.  Skin: Negative for rash.  Neurological: Negative for dizziness, tingling, focal weakness, seizures, weakness and headaches.  Endo/Heme/Allergies: Does not bruise/bleed easily.  Psychiatric/Behavioral: Negative for depression and suicidal ideas. The patient does not have insomnia.       Allergies  Allergen Reactions   Hydrochlorothiazide Other (See Comments)    History of pancreatitis   Amoxicillin Nausea Only   Meloxicam Other (See Comments)   Penicillins Nausea Only   Simvastatin Nausea Only   Sulfamethoxazole-Trimethoprim Nausea Only   Tramadol Nausea Only     Past Medical History:  Diagnosis Date   Anemia    Breast screening, unspecified  Eczema    Family history of colonic polyps    Lump or mass in breast    Neoplasm of uncertain behavior of breast     Personal history of colonic polyps    Special screening for malignant neoplasms, colon    Unspecified essential hypertension      Past Surgical History:  Procedure Laterality Date   BREAST BIOPSY Right Age 21   BREAST BIOPSY Left 07-21-12   Core biopsy showed atypical lobular hyperplasia and a 2 mm foci, atypical ductal hyperplasia and a 1 mm foci. There was no evidence of in situ or invasive cancer   BREAST CYST ASPIRATION  August 2013   inconclusive, with atypical ductal cells identified   COLONOSCOPY  2002, 2005, 2009, 2012   polyps    TOTAL ABDOMINAL HYSTERECTOMY W/ BILATERAL SALPINGOOPHORECTOMY  Age 43    Social History   Socioeconomic History   Marital status: Married    Spouse name: Not on file   Number of children: Not on file   Years of education: Not on file   Highest education level: Not on file  Occupational History   Not on file  Social Needs   Financial resource strain: Not on file   Food insecurity    Worry: Not on file    Inability: Not on file   Transportation needs    Medical: Not on file    Non-medical: Not on file  Tobacco Use   Smoking status: Never Smoker   Smokeless tobacco: Never Used  Substance and Sexual Activity   Alcohol use: Yes    Comment: occassionally   Drug use: No   Sexual activity: Not on file  Lifestyle   Physical activity    Days per week: Not on file    Minutes per session: Not on file   Stress: Not on file  Relationships   Social connections    Talks on phone: Not on file    Gets together: Not on file    Attends religious service: Not on file    Active member of club or organization: Not on file    Attends meetings of clubs or organizations: Not on file    Relationship status: Not on file   Intimate partner violence    Fear of current or ex partner: Not on file    Emotionally abused: Not on file    Physically abused: Not on file    Forced sexual activity: Not on file  Other Topics Concern    Not on file  Social History Narrative   Not on file    Family History  Problem Relation Age of Onset   Colon cancer Father    Colon cancer Other        Paternal first cousin   Hypertension Mother    Anemia Mother      Current Outpatient Medications:    amLODipine (NORVASC) 10 MG tablet, TAKE 1 TABLET(10 MG) BY MOUTH DAILY, Disp: 30 tablet, Rfl: 5   Cholecalciferol (VITAMIN D-3) 5000 units TABS, Take 5,000 Units by mouth., Disp: , Rfl:    Docusate Sodium (STOOL SOFTENER LAXATIVE PO), Take by mouth 3 (three) times a week., Disp: , Rfl:    folic acid (FOLVITE) 1 MG tablet, TAKE 1 TABLET(1 MG) BY MOUTH DAILY, Disp: 30 tablet, Rfl: 1   lisinopril (PRINIVIL,ZESTRIL) 40 MG tablet, TAKE 1 TABLET(40 MG) BY MOUTH DAILY, Disp: 30 tablet, Rfl: 5   Multiple Vitamins-Iron (MULTIVITAMIN/IRON PO), Take 1 capsule by mouth daily.,  Disp: , Rfl:    niacin (NIASPAN) 500 MG CR tablet, TAKE 1 TABLET(500 MG) BY MOUTH AT BEDTIME, Disp: 30 tablet, Rfl: 5   pantoprazole (PROTONIX) 40 MG tablet, Take 1 tablet (40 mg total) by mouth daily., Disp: 30 tablet, Rfl: 12   Saccharomyces boulardii (FLORASTOR PO), Take 1 capsule by mouth 3 (three) times a week., Disp: , Rfl:    triamcinolone cream (KENALOG) 0.1 %, Apply 1 application topically 2 (two) times daily. (Patient taking differently: Apply 1 application topically as needed. ), Disp: 30 g, Rfl: 0   vitamin B-12 (CYANOCOBALAMIN) 1000 MCG tablet, Take 1,000 mcg by mouth daily., Disp: , Rfl:   Physical exam:  Vitals:   04/30/19 1001  BP: 137/71  Pulse: 98  Resp: 18  Temp: 97.6 F (36.4 C)  TempSrc: Tympanic  SpO2: 100%  Weight: 154 lb 3.2 oz (69.9 kg)   Physical Exam Constitutional:      General: She is not in acute distress. HENT:     Head: Normocephalic and atraumatic.  Eyes:     Pupils: Pupils are equal, round, and reactive to light.  Neck:     Musculoskeletal: Normal range of motion.  Cardiovascular:     Rate and Rhythm: Regular  rhythm. Tachycardia present.     Heart sounds: Normal heart sounds.  Pulmonary:     Effort: Pulmonary effort is normal.     Breath sounds: Normal breath sounds.  Abdominal:     General: Bowel sounds are normal.     Palpations: Abdomen is soft.  Skin:    General: Skin is warm and dry.  Neurological:     Mental Status: She is alert and oriented to person, place, and time.      CMP Latest Ref Rng & Units 12/09/2017  Glucose 65 - 99 mg/dL 150(H)  BUN 6 - 20 mg/dL 10  Creatinine 0.44 - 1.00 mg/dL 0.60  Sodium 135 - 145 mmol/L 137  Potassium 3.5 - 5.1 mmol/L 3.3(L)  Chloride 101 - 111 mmol/L 101  CO2 22 - 32 mmol/L 21(L)  Calcium 8.9 - 10.3 mg/dL 9.2  Total Protein 6.5 - 8.1 g/dL 8.5(H)  Total Bilirubin 0.3 - 1.2 mg/dL 1.0  Alkaline Phos 38 - 126 U/L 45  AST 15 - 41 U/L 30  ALT 14 - 54 U/L 13(L)   CBC Latest Ref Rng & Units 04/30/2019  WBC 4.0 - 10.5 K/uL 4.4  Hemoglobin 12.0 - 15.0 g/dL 9.2(L)  Hematocrit 36.0 - 46.0 % 28.2(L)  Platelets 150 - 400 K/uL 271      Assessment and plan- Patient is a 63 y.o. female with chronic normocytic anemia of unclear etiology  Patient has chronic normocytic anemia with a hb between 9-10 for last 3-4 years. No other cytopenias. She had some evidence of b12 deficiency in the past for which she takes PO b12. Anemia remains unchanged. Holding off on bone marrow biopsy due to stable anemia and absence of other cytopenias.   I will see her back in 6 months with cbc ferritin and iron studies   Visit Diagnosis 1. Normocytic anemia      Dr. Randa Evens, MD, MPH [facility] at [facility] [phone] 05/02/2019 9:46 AM

## 2019-05-04 ENCOUNTER — Other Ambulatory Visit: Payer: Self-pay | Admitting: Adult Health

## 2019-05-04 ENCOUNTER — Other Ambulatory Visit: Payer: Self-pay | Admitting: Oncology

## 2019-05-04 DIAGNOSIS — I10 Essential (primary) hypertension: Secondary | ICD-10-CM

## 2019-05-16 ENCOUNTER — Other Ambulatory Visit: Payer: Self-pay | Admitting: Radiology

## 2019-06-02 ENCOUNTER — Other Ambulatory Visit: Payer: Self-pay | Admitting: Adult Health

## 2019-06-02 DIAGNOSIS — E782 Mixed hyperlipidemia: Secondary | ICD-10-CM

## 2019-06-09 ENCOUNTER — Other Ambulatory Visit: Payer: Self-pay | Admitting: Adult Health

## 2019-06-09 ENCOUNTER — Ambulatory Visit: Payer: Self-pay | Admitting: Internal Medicine

## 2019-06-09 DIAGNOSIS — I10 Essential (primary) hypertension: Secondary | ICD-10-CM

## 2019-06-13 DIAGNOSIS — E7800 Pure hypercholesterolemia, unspecified: Secondary | ICD-10-CM | POA: Diagnosis not present

## 2019-06-13 DIAGNOSIS — Z880 Allergy status to penicillin: Secondary | ICD-10-CM | POA: Diagnosis not present

## 2019-06-13 DIAGNOSIS — R202 Paresthesia of skin: Secondary | ICD-10-CM | POA: Diagnosis not present

## 2019-06-13 DIAGNOSIS — Z885 Allergy status to narcotic agent status: Secondary | ICD-10-CM | POA: Diagnosis not present

## 2019-06-13 DIAGNOSIS — Z6823 Body mass index (BMI) 23.0-23.9, adult: Secondary | ICD-10-CM | POA: Diagnosis not present

## 2019-06-13 DIAGNOSIS — R42 Dizziness and giddiness: Secondary | ICD-10-CM | POA: Diagnosis not present

## 2019-06-13 DIAGNOSIS — I10 Essential (primary) hypertension: Secondary | ICD-10-CM | POA: Diagnosis not present

## 2019-07-03 DIAGNOSIS — Z20828 Contact with and (suspected) exposure to other viral communicable diseases: Secondary | ICD-10-CM | POA: Diagnosis not present

## 2019-07-15 ENCOUNTER — Other Ambulatory Visit: Payer: Self-pay | Admitting: Nurse Practitioner

## 2019-08-10 ENCOUNTER — Other Ambulatory Visit: Payer: Self-pay

## 2019-08-10 DIAGNOSIS — D649 Anemia, unspecified: Secondary | ICD-10-CM

## 2019-08-10 DIAGNOSIS — I10 Essential (primary) hypertension: Secondary | ICD-10-CM

## 2019-08-10 DIAGNOSIS — Z0001 Encounter for general adult medical examination with abnormal findings: Secondary | ICD-10-CM

## 2019-08-10 DIAGNOSIS — D508 Other iron deficiency anemias: Secondary | ICD-10-CM

## 2019-08-10 DIAGNOSIS — R300 Dysuria: Secondary | ICD-10-CM

## 2019-08-10 DIAGNOSIS — K2100 Gastro-esophageal reflux disease with esophagitis, without bleeding: Secondary | ICD-10-CM

## 2019-08-10 DIAGNOSIS — E782 Mixed hyperlipidemia: Secondary | ICD-10-CM

## 2019-08-11 ENCOUNTER — Ambulatory Visit: Payer: Self-pay | Admitting: Internal Medicine

## 2019-08-14 ENCOUNTER — Telehealth: Payer: Self-pay

## 2019-08-14 NOTE — Telephone Encounter (Signed)
CONFIRMED AND SCREENED FOR 08-18-19 OV. °

## 2019-08-17 DIAGNOSIS — D508 Other iron deficiency anemias: Secondary | ICD-10-CM | POA: Diagnosis not present

## 2019-08-17 DIAGNOSIS — Z0001 Encounter for general adult medical examination with abnormal findings: Secondary | ICD-10-CM | POA: Diagnosis not present

## 2019-08-17 DIAGNOSIS — K2100 Gastro-esophageal reflux disease with esophagitis, without bleeding: Secondary | ICD-10-CM | POA: Diagnosis not present

## 2019-08-17 DIAGNOSIS — I10 Essential (primary) hypertension: Secondary | ICD-10-CM | POA: Diagnosis not present

## 2019-08-18 ENCOUNTER — Other Ambulatory Visit: Payer: Self-pay

## 2019-08-18 ENCOUNTER — Encounter (INDEPENDENT_AMBULATORY_CARE_PROVIDER_SITE_OTHER): Payer: Self-pay

## 2019-08-18 ENCOUNTER — Encounter: Payer: Self-pay | Admitting: Internal Medicine

## 2019-08-18 ENCOUNTER — Ambulatory Visit: Payer: BC Managed Care – PPO | Admitting: Internal Medicine

## 2019-08-18 DIAGNOSIS — D508 Other iron deficiency anemias: Secondary | ICD-10-CM | POA: Diagnosis not present

## 2019-08-18 DIAGNOSIS — I10 Essential (primary) hypertension: Secondary | ICD-10-CM | POA: Diagnosis not present

## 2019-08-18 LAB — COMPREHENSIVE METABOLIC PANEL
ALT: 22 IU/L (ref 0–32)
AST: 63 IU/L — ABNORMAL HIGH (ref 0–40)
Albumin/Globulin Ratio: 1.4 (ref 1.2–2.2)
Albumin: 4.4 g/dL (ref 3.8–4.8)
Alkaline Phosphatase: 64 IU/L (ref 39–117)
BUN/Creatinine Ratio: 11 — ABNORMAL LOW (ref 12–28)
BUN: 8 mg/dL (ref 8–27)
Bilirubin Total: 0.7 mg/dL (ref 0.0–1.2)
CO2: 22 mmol/L (ref 20–29)
Calcium: 8.8 mg/dL (ref 8.7–10.3)
Chloride: 100 mmol/L (ref 96–106)
Creatinine, Ser: 0.75 mg/dL (ref 0.57–1.00)
GFR calc Af Amer: 99 mL/min/{1.73_m2} (ref >59)
GFR calc non Af Amer: 86 mL/min/{1.73_m2} (ref >59)
Globulin, Total: 3.1 g/dL (ref 1.5–4.5)
Glucose: 88 mg/dL (ref 65–99)
Potassium: 3.6 mmol/L (ref 3.5–5.2)
Sodium: 142 mmol/L (ref 134–144)
Total Protein: 7.5 g/dL (ref 6.0–8.5)

## 2019-08-18 NOTE — Progress Notes (Signed)
[facility] [place], [place] [address]  Internal MEDICINE  Office Visit Note  Patient Name: Laurie Gross  [id number]  [id number]  Date of Service: 08/24/2019  Chief Complaint  Patient presents with  . Hypertension  . Anemia  . Labs Only    pt has labs done yesterday and results are in, i already went through med list    HPI Pt is here to follow up on labs and BP. She is feeling well. Anemia is improving, she does get iron infusion by hematology. No other complaints today  Current Medication: Outpatient Encounter Medications as of 08/18/2019  Medication Sig  . amLODipine (NORVASC) 10 MG tablet TAKE 1 TABLET(10 MG) BY MOUTH DAILY  . Cholecalciferol (VITAMIN D-3) 5000 units TABS Take 5,000 Units by mouth.  Mariane Baumgarten Sodium (STOOL SOFTENER LAXATIVE PO) Take by mouth as needed.   . folic acid (FOLVITE) 1 MG tablet TAKE 1 TABLET BY MOUTH EVERY DAY  . lisinopril (ZESTRIL) 40 MG tablet TAKE 1 TABLET(40 MG) BY MOUTH DAILY  . Multiple Vitamins-Iron (MULTIVITAMIN/IRON PO) Take 1 capsule by mouth daily.  . niacin (NIASPAN) 500 MG CR tablet TAKE 1 TABLET(500 MG) BY MOUTH AT BEDTIME  . pantoprazole (PROTONIX) 40 MG tablet Take 1 tablet (40 mg total) by mouth daily.  . Saccharomyces boulardii (FLORASTOR PO) Take 1 capsule by mouth 3 (three) times a week.  . triamcinolone cream (KENALOG) 0.1 % Apply 1 application topically 2 (two) times daily. (Patient taking differently: Apply 1 application topically as needed. )  . vitamin B-12 (CYANOCOBALAMIN) 1000 MCG tablet Take 1,000 mcg by mouth daily.   No facility-administered encounter medications on file as of 08/18/2019.     Surgical History: Past Surgical History:  Procedure Laterality Date  . BREAST BIOPSY Right Age 63  . BREAST BIOPSY Left 07-21-12   Core biopsy showed atypical lobular hyperplasia and a 2 mm foci, atypical ductal hyperplasia and a 1 mm foci. There was no evidence of in situ or invasive cancer  .  BREAST CYST ASPIRATION  August 2013   inconclusive, with atypical ductal cells identified  . COLONOSCOPY  2002, 2005, 2009, 2012   polyps   . TOTAL ABDOMINAL HYSTERECTOMY W/ BILATERAL SALPINGOOPHORECTOMY  Age 47    Medical History: Past Medical History:  Diagnosis Date  . Anemia   . Breast screening, unspecified   . Eczema   . Family history of colonic polyps   . Lump or mass in breast   . Neoplasm of uncertain behavior of breast   . Personal history of colonic polyps   . Special screening for malignant neoplasms, colon   . Unspecified essential hypertension     Family History: Family History  Problem Relation Age of Onset  . Colon cancer Father   . Colon cancer Other        Paternal first cousin  . Hypertension Mother   . Anemia Mother     Social History   Socioeconomic History  . Marital status: Married    Spouse name: Not on file  . Number of children: Not on file  . Years of education: Not on file  . Highest education level: Not on file  Occupational History  . Not on file  Social Needs  . Financial resource strain: Not on file  . Food insecurity    Worry: Not on file    Inability: Not on file  . Transportation needs    Medical: Not on file  Non-medical: Not on file  Tobacco Use  . Smoking status: Never Smoker  . Smokeless tobacco: Never Used  Substance and Sexual Activity  . Alcohol use: Yes    Comment: occassionally  . Drug use: No  . Sexual activity: Not on file  Lifestyle  . Physical activity    Days per week: Not on file    Minutes per session: Not on file  . Stress: Not on file  Relationships  . Social Herbalist on phone: Not on file    Gets together: Not on file    Attends religious service: Not on file    Active member of club or organization: Not on file    Attends meetings of clubs or organizations: Not on file    Relationship status: Not on file  . Intimate partner violence    Fear of current or ex partner: Not on file     Emotionally abused: Not on file    Physically abused: Not on file    Forced sexual activity: Not on file  Other Topics Concern  . Not on file  Social History Narrative  . Not on file      Review of Systems  Constitutional: Negative for chills, diaphoresis and fatigue.  HENT: Negative for ear pain, postnasal drip and sinus pressure.   Eyes: Negative for photophobia, discharge, redness, itching and visual disturbance.  Respiratory: Negative for cough, shortness of breath and wheezing.   Cardiovascular: Negative for chest pain, palpitations and leg swelling.  Gastrointestinal: Negative for abdominal pain, constipation, diarrhea, nausea and vomiting.  Genitourinary: Negative for dysuria and flank pain.  Musculoskeletal: Negative for arthralgias, back pain, gait problem and neck pain.  Skin: Negative for color change.  Allergic/Immunologic: Negative for environmental allergies and food allergies.  Neurological: Negative for dizziness and headaches.  Hematological: Does not bruise/bleed easily.  Psychiatric/Behavioral: Negative for agitation, behavioral problems (depression) and hallucinations.    Vital Signs: BP (!) 151/71   Pulse (!) 112   Temp 98.2 F (36.8 C)   Resp 16   Ht 5\' 7"  (1.702 m)   Wt 153 lb 3.2 oz (69.5 kg)   SpO2 99%   BMI 23.99 kg/m    Physical Exam Constitutional:      Appearance: Normal appearance.  HENT:     Head: Normocephalic and atraumatic.  Cardiovascular:     Rate and Rhythm: Normal rate and regular rhythm.     Pulses: Normal pulses.     Heart sounds: Normal heart sounds. No murmur.  Pulmonary:     Effort: Pulmonary effort is normal.     Breath sounds: Normal breath sounds.  Neurological:     Mental Status: She is alert.    Assessment/Plan: 1. Essential hypertension, benign - Continue all meds as before   2. Iron deficiency anemia secondary to inadequate dietary iron intake - Continue Monitor   General Counseling: Katosha verbalizes  understanding of the findings of todays visit and agrees with plan of treatment. I have discussed any further diagnostic evaluation that may be needed or ordered today. We also reviewed her medications today. she has been encouraged to call the office with any questions or concerns that should arise related to todays visit.   Time spent:15 Minutes  Dr Lavera Guise Internal medicine

## 2019-11-02 ENCOUNTER — Inpatient Hospital Stay: Payer: BC Managed Care – PPO | Admitting: Oncology

## 2019-11-02 ENCOUNTER — Inpatient Hospital Stay: Payer: BC Managed Care – PPO

## 2019-11-02 DIAGNOSIS — S93529A Sprain of metatarsophalangeal joint of unspecified toe(s), initial encounter: Secondary | ICD-10-CM | POA: Diagnosis not present

## 2019-12-03 ENCOUNTER — Other Ambulatory Visit: Payer: Self-pay | Admitting: Adult Health

## 2019-12-03 DIAGNOSIS — E782 Mixed hyperlipidemia: Secondary | ICD-10-CM

## 2019-12-08 ENCOUNTER — Encounter: Payer: BC Managed Care – PPO | Admitting: Internal Medicine

## 2019-12-26 ENCOUNTER — Other Ambulatory Visit: Payer: Self-pay | Admitting: Adult Health

## 2019-12-26 DIAGNOSIS — I10 Essential (primary) hypertension: Secondary | ICD-10-CM

## 2019-12-28 ENCOUNTER — Other Ambulatory Visit: Payer: Self-pay

## 2019-12-28 DIAGNOSIS — D649 Anemia, unspecified: Secondary | ICD-10-CM

## 2019-12-29 ENCOUNTER — Inpatient Hospital Stay: Payer: BC Managed Care – PPO | Attending: Oncology

## 2019-12-29 ENCOUNTER — Other Ambulatory Visit: Payer: Self-pay

## 2019-12-29 ENCOUNTER — Encounter: Payer: Self-pay | Admitting: Oncology

## 2019-12-29 ENCOUNTER — Inpatient Hospital Stay: Payer: BC Managed Care – PPO | Admitting: Oncology

## 2019-12-29 VITALS — BP 173/83 | HR 109 | Temp 98.7°F | Ht 67.0 in | Wt 148.3 lb

## 2019-12-29 DIAGNOSIS — K219 Gastro-esophageal reflux disease without esophagitis: Secondary | ICD-10-CM | POA: Insufficient documentation

## 2019-12-29 DIAGNOSIS — Z800 Family history of malignant neoplasm of digestive organs: Secondary | ICD-10-CM | POA: Insufficient documentation

## 2019-12-29 DIAGNOSIS — I10 Essential (primary) hypertension: Secondary | ICD-10-CM | POA: Diagnosis not present

## 2019-12-29 DIAGNOSIS — E785 Hyperlipidemia, unspecified: Secondary | ICD-10-CM | POA: Diagnosis not present

## 2019-12-29 DIAGNOSIS — E538 Deficiency of other specified B group vitamins: Secondary | ICD-10-CM

## 2019-12-29 DIAGNOSIS — D649 Anemia, unspecified: Secondary | ICD-10-CM | POA: Diagnosis not present

## 2019-12-29 LAB — CBC WITH DIFFERENTIAL/PLATELET
Abs Immature Granulocytes: 0.01 10*3/uL (ref 0.00–0.07)
Basophils Absolute: 0 10*3/uL (ref 0.0–0.1)
Basophils Relative: 1 %
Eosinophils Absolute: 0.1 10*3/uL (ref 0.0–0.5)
Eosinophils Relative: 2 %
HCT: 27.5 % — ABNORMAL LOW (ref 36.0–46.0)
Hemoglobin: 9.1 g/dL — ABNORMAL LOW (ref 12.0–15.0)
Immature Granulocytes: 0 %
Lymphocytes Relative: 39 %
Lymphs Abs: 2 10*3/uL (ref 0.7–4.0)
MCH: 26.9 pg (ref 26.0–34.0)
MCHC: 33.1 g/dL (ref 30.0–36.0)
MCV: 81.4 fL (ref 80.0–100.0)
Monocytes Absolute: 0.3 10*3/uL (ref 0.1–1.0)
Monocytes Relative: 5 %
Neutro Abs: 2.7 10*3/uL (ref 1.7–7.7)
Neutrophils Relative %: 53 %
Platelets: 216 10*3/uL (ref 150–400)
RBC: 3.38 MIL/uL — ABNORMAL LOW (ref 3.87–5.11)
RDW: 14.6 % (ref 11.5–15.5)
WBC: 5.1 10*3/uL (ref 4.0–10.5)
nRBC: 0.4 % — ABNORMAL HIGH (ref 0.0–0.2)

## 2019-12-29 LAB — FERRITIN: Ferritin: 470 ng/mL — ABNORMAL HIGH (ref 11–307)

## 2019-12-29 LAB — IRON AND TIBC
Iron: 94 ug/dL (ref 28–170)
Saturation Ratios: 36 % — ABNORMAL HIGH (ref 10.4–31.8)
TIBC: 265 ug/dL (ref 250–450)
UIBC: 171 ug/dL

## 2019-12-29 NOTE — Progress Notes (Signed)
Patient stated that she had been doing well with no complaints. 

## 2019-12-31 NOTE — Progress Notes (Signed)
Hematology/Oncology Consult note [facility]  [phone] [phone]  Patient Care Team: Lavera Guise, MD as PCP - General (Internal Medicine) Bary Castilla Forest Gleason, MD (General Surgery)   Name of the patient: Laurie Gross  [id number]  [birth year]   Date of visit: 12/31/19  Diagnosis-normocytic anemia of unclear etiology  Chief complaint/ Reason for visit-routine follow-up of anemia  Heme/Onc history: patient is a 64 year old African-American female with a past medical history significant for hypertension, hyperlipidemia and GERD among other medical problems. She has been referred to [place] for evaluation and management of anemia as well as elevated ferritin.  On review of outside labs patient has had chronic anemia and her hemoglobin has been between 9-10 since 2015. And her MCV ranges between 74-80. Hemoglobin electrophoresis in the past has been normal. Most recent CBC from 11/06/2017 showed white count of 4.2, H&H of 8.9/28.2 with an MCV of 84 and a platelet count of 314. TSH was within normal limits. CMP was within normal limits. Recent iron studies from 11/06/2017 showed elevated ferritin of 610. Iron saturation, serum iron and TIBC were normal. Of note patient has had an elevated ferritin since July 2017 that has been gradually increasing from 335 11/04/1966 to 662 in December 2018. Transferrin saturation has always been less than 35%. LFTs have always been normal.  Results of blood work from 12/09/2017 were as follows: CBC showed white count of 5.5, H&H of 10.1/30.7 with an MCV of 82.1 and a platelet count of 356. Differential was normal. CMP was essentially normal except for a mildly low potassium of 3.3 and elevated blood sugar of 150. LFTs and creatinine was normal serum calcium was normal. Ferritin levels were mildly elevated at 443 lowercompared to a prior value of 610. Iron studies were within normal limits. Folate was mildly  low at 5.3. B12 levels were low at 192. ESR was normal and ANA comprehensive panel was negative. Multiple myeloma panel revealed no monoclonal protein. Polyclonal gammopathy was noted. Serum free light chains revealed mildly elevated kappa of 24 with a normal kappa lambda ratio 1.2. Reticulocyte count was low normal at 1.8.  Interval history-patient reports doing well.  Appetite and weight is stable.  Denies any complaints at this time  ECOG PS- 1 Pain scale- 0   Review of systems- Review of Systems  Constitutional: Negative for chills, fever, malaise/fatigue and weight loss.  HENT: Negative for congestion, ear discharge and nosebleeds.   Eyes: Negative for blurred vision.  Respiratory: Negative for cough, hemoptysis, sputum production, shortness of breath and wheezing.   Cardiovascular: Negative for chest pain, palpitations, orthopnea and claudication.  Gastrointestinal: Negative for abdominal pain, blood in stool, constipation, diarrhea, heartburn, melena, nausea and vomiting.  Genitourinary: Negative for dysuria, flank pain, frequency, hematuria and urgency.  Musculoskeletal: Negative for back pain, joint pain and myalgias.  Skin: Negative for rash.  Neurological: Negative for dizziness, tingling, focal weakness, seizures, weakness and headaches.  Endo/Heme/Allergies: Does not bruise/bleed easily.  Psychiatric/Behavioral: Negative for depression and suicidal ideas. The patient does not have insomnia.       Allergies  Allergen Reactions   Hydrochlorothiazide Other (See Comments)    History of pancreatitis   Amoxicillin Nausea Only   Meloxicam Other (See Comments)   Penicillins Nausea Only   Simvastatin Nausea Only   Sulfamethoxazole-Trimethoprim Nausea Only   Tramadol Nausea Only     Past Medical History:  Diagnosis Date   Anemia    Breast screening, unspecified  Eczema    Family history of colonic polyps    Lump or mass in breast    Neoplasm of  uncertain behavior of breast    Personal history of colonic polyps    Special screening for malignant neoplasms, colon    Unspecified essential hypertension      Past Surgical History:  Procedure Laterality Date   BREAST BIOPSY Right Age 35   BREAST BIOPSY Left 07-21-12   Core biopsy showed atypical lobular hyperplasia and a 2 mm foci, atypical ductal hyperplasia and a 1 mm foci. There was no evidence of in situ or invasive cancer   BREAST CYST ASPIRATION  August 2013   inconclusive, with atypical ductal cells identified   COLONOSCOPY  2002, 2005, 2009, 2012   polyps    TOTAL ABDOMINAL HYSTERECTOMY W/ BILATERAL SALPINGOOPHORECTOMY  Age 56    Social History   Socioeconomic History   Marital status: Married    Spouse name: Not on file   Number of children: Not on file   Years of education: Not on file   Highest education level: Not on file  Occupational History   Not on file  Tobacco Use   Smoking status: Never Smoker   Smokeless tobacco: Never Used  Substance and Sexual Activity   Alcohol use: Yes    Comment: occassionally   Drug use: No   Sexual activity: Not on file  Other Topics Concern   Not on file  Social History Narrative   Not on file   Social Determinants of Health   Financial Resource Strain:    Difficulty of Paying Living Expenses:   Food Insecurity:    Worried About Charity fundraiser in the Last Year:    Arboriculturist in the Last Year:   Transportation Needs:    Film/video editor (Medical):    Lack of Transportation (Non-Medical):   Physical Activity:    Days of Exercise per Week:    Minutes of Exercise per Session:   Stress:    Feeling of Stress :   Social Connections:    Frequency of Communication with Friends and Family:    Frequency of Social Gatherings with Friends and Family:    Attends Religious Services:    Active Member of Clubs or Organizations:    Attends Music therapist:     Marital Status:   Intimate Partner Violence:    Fear of Current or Ex-Partner:    Emotionally Abused:    Physically Abused:    Sexually Abused:     Family History  Problem Relation Age of Onset   Colon cancer Father    Colon cancer Other        Paternal first cousin   Hypertension Mother    Anemia Mother      Current Outpatient Medications:    amLODipine (NORVASC) 10 MG tablet, TAKE 1 TABLET(10 MG) BY MOUTH DAILY, Disp: 30 tablet, Rfl: 5   Cholecalciferol (VITAMIN D-3) 5000 units TABS, Take 5,000 Units by mouth., Disp: , Rfl:    Docusate Sodium (STOOL SOFTENER LAXATIVE PO), Take by mouth as needed. , Disp: , Rfl:    folic acid (FOLVITE) 1 MG tablet, TAKE 1 TABLET BY MOUTH EVERY DAY, Disp: 90 tablet, Rfl: 1   lisinopril (ZESTRIL) 40 MG tablet, TAKE 1 TABLET(40 MG) BY MOUTH DAILY, Disp: 30 tablet, Rfl: 3   Multiple Vitamins-Iron (MULTIVITAMIN/IRON PO), Take 1 capsule by mouth daily., Disp: , Rfl:    niacin (NIASPAN)  500 MG CR tablet, TAKE 1 TABLET(500 MG) BY MOUTH AT BEDTIME, Disp: 30 tablet, Rfl: 3   pantoprazole (PROTONIX) 40 MG tablet, Take 1 tablet (40 mg total) by mouth daily., Disp: 30 tablet, Rfl: 12   Saccharomyces boulardii (FLORASTOR PO), Take 1 capsule by mouth 3 (three) times a week., Disp: , Rfl:    triamcinolone cream (KENALOG) 0.1 %, Apply 1 application topically 2 (two) times daily. (Patient taking differently: Apply 1 application topically as needed. ), Disp: 30 g, Rfl: 0   vitamin B-12 (CYANOCOBALAMIN) 1000 MCG tablet, Take 1,000 mcg by mouth daily., Disp: , Rfl:   Physical exam:  Vitals:   12/29/19 0917 12/29/19 0918  BP: (!) 173/83   Pulse: (!) 109   Temp: 98.7 F (37.1 C)   TempSrc: Tympanic   SpO2:  99%  Weight: 148 lb 4.8 oz (67.3 kg)   Height: 5' 7"  (1.702 m)    Physical Exam Constitutional:      General: She is not in acute distress. HENT:     Head: Normocephalic and atraumatic.  Eyes:     Pupils: Pupils are equal, round, and  reactive to light.  Cardiovascular:     Rate and Rhythm: Regular rhythm. Tachycardia present.     Heart sounds: Normal heart sounds.  Pulmonary:     Effort: Pulmonary effort is normal.     Breath sounds: Normal breath sounds.  Abdominal:     General: Bowel sounds are normal.     Palpations: Abdomen is soft.  Musculoskeletal:     Cervical back: Normal range of motion.  Skin:    General: Skin is warm and dry.  Neurological:     Mental Status: She is alert and oriented to person, place, and time.      CMP Latest Ref Rng & Units 08/17/2019  Glucose 65 - 99 mg/dL 88  BUN 8 - 27 mg/dL 8  Creatinine 0.57 - 1.00 mg/dL 0.75  Sodium 134 - 144 mmol/L 142  Potassium 3.5 - 5.2 mmol/L 3.6  Chloride 96 - 106 mmol/L 100  CO2 20 - 29 mmol/L 22  Calcium 8.7 - 10.3 mg/dL 8.8  Total Protein 6.0 - 8.5 g/dL 7.5  Total Bilirubin 0.0 - 1.2 mg/dL 0.7  Alkaline Phos 39 - 117 IU/L 64  AST 0 - 40 IU/L 63(H)  ALT 0 - 32 IU/L 22   CBC Latest Ref Rng & Units 12/29/2019  WBC 4.0 - 10.5 K/uL 5.1  Hemoglobin 12.0 - 15.0 g/dL 9.1(L)  Hematocrit 36.0 - 46.0 % 27.5(L)  Platelets 150 - 400 K/uL 216      Assessment and plan- Patient is a 64 y.o. female with normocytic anemia of unclear etiology here for routine follow-up  Patient has had normocytic anemia for last 2 years now and her hemoglobin has been stable between 9-10.  She has borderline microcytosis but her iron studies have been normal.  She has had a complete anemia work-up in the past which was unrevealing.  We will continue to hold off on a bone marrow biopsy at this time since her hemoglobin is not trending down.  I will see her back in 6 months with CBC with differential, B12 ferritin and iron studies.  She has no baseline CKD   Visit Diagnosis 1. Normocytic anemia   2. B12 deficiency      Dr. Randa Evens, MD, MPH [facility] [phone] 12/31/2019 4:52 PM

## 2020-01-08 ENCOUNTER — Telehealth: Payer: Self-pay

## 2020-01-08 NOTE — Telephone Encounter (Signed)
Confirmed and screened for 01-12-20 ov.

## 2020-01-11 ENCOUNTER — Telehealth: Payer: Self-pay

## 2020-01-11 NOTE — Telephone Encounter (Signed)
Patient rescheduled appointment on 01/12/2020 to 03/08/2020. klh

## 2020-01-12 ENCOUNTER — Encounter: Payer: BC Managed Care – PPO | Admitting: Internal Medicine

## 2020-01-12 ENCOUNTER — Other Ambulatory Visit: Payer: Self-pay | Admitting: Internal Medicine

## 2020-01-12 DIAGNOSIS — I10 Essential (primary) hypertension: Secondary | ICD-10-CM

## 2020-01-18 DIAGNOSIS — Z1231 Encounter for screening mammogram for malignant neoplasm of breast: Secondary | ICD-10-CM | POA: Diagnosis not present

## 2020-01-28 DIAGNOSIS — H6123 Impacted cerumen, bilateral: Secondary | ICD-10-CM | POA: Diagnosis not present

## 2020-01-28 DIAGNOSIS — H6063 Unspecified chronic otitis externa, bilateral: Secondary | ICD-10-CM | POA: Diagnosis not present

## 2020-03-01 ENCOUNTER — Other Ambulatory Visit: Payer: Self-pay | Admitting: Nurse Practitioner

## 2020-03-04 ENCOUNTER — Telehealth: Payer: Self-pay

## 2020-03-04 NOTE — Telephone Encounter (Signed)
Confirmed and screened for 03-08-20 ov. 

## 2020-03-08 ENCOUNTER — Other Ambulatory Visit: Payer: Self-pay

## 2020-03-08 ENCOUNTER — Encounter: Payer: Self-pay | Admitting: Internal Medicine

## 2020-03-08 ENCOUNTER — Ambulatory Visit (INDEPENDENT_AMBULATORY_CARE_PROVIDER_SITE_OTHER): Payer: BC Managed Care – PPO | Admitting: Internal Medicine

## 2020-03-08 DIAGNOSIS — R300 Dysuria: Secondary | ICD-10-CM | POA: Diagnosis not present

## 2020-03-08 DIAGNOSIS — I10 Essential (primary) hypertension: Secondary | ICD-10-CM | POA: Diagnosis not present

## 2020-03-08 DIAGNOSIS — Z0001 Encounter for general adult medical examination with abnormal findings: Secondary | ICD-10-CM | POA: Diagnosis not present

## 2020-03-08 DIAGNOSIS — D508 Other iron deficiency anemias: Secondary | ICD-10-CM | POA: Diagnosis not present

## 2020-03-08 DIAGNOSIS — K219 Gastro-esophageal reflux disease without esophagitis: Secondary | ICD-10-CM | POA: Diagnosis not present

## 2020-03-08 NOTE — Progress Notes (Signed)
[facility] [place], [place] [address]  Internal MEDICINE  Office Visit Note  Patient Name: Laurie Gross  [id number]  [id number]  Date of Service: 03/11/2020  Chief Complaint  Patient presents with  . Annual Exam  . Hypertension  . Anemia     HPI Pt is here for routine health maintenance examination. No major complaints, takes all medications as prescribed.pt does have h/o normocytic anemia. Pt did have EGD and colonoscopy in 2012 which showed a benign polyp and chronic gastritis. She seems Hematology for anemia as well. No other problems. Denies any fever or chills   Current Medication: Outpatient Encounter Medications as of 03/08/2020  Medication Sig  . amLODipine (NORVASC) 10 MG tablet TAKE 1 TABLET(10 MG) BY MOUTH DAILY  . Cholecalciferol (VITAMIN D-3) 5000 units TABS Take 5,000 Units by mouth.  Mariane Baumgarten Sodium (STOOL SOFTENER LAXATIVE PO) Take by mouth as needed.   . folic acid (FOLVITE) 1 MG tablet TAKE 1 TABLET BY MOUTH EVERY DAY  . lisinopril (ZESTRIL) 40 MG tablet TAKE 1 TABLET(40 MG) BY MOUTH DAILY  . Multiple Vitamins-Iron (MULTIVITAMIN/IRON PO) Take 1 capsule by mouth daily.  . niacin (NIASPAN) 500 MG CR tablet TAKE 1 TABLET(500 MG) BY MOUTH AT BEDTIME  . pantoprazole (PROTONIX) 40 MG tablet Take 1 tablet (40 mg total) by mouth daily.  . Saccharomyces boulardii (FLORASTOR PO) Take 1 capsule by mouth 3 (three) times a week.  . triamcinolone cream (KENALOG) 0.1 % Apply 1 application topically 2 (two) times daily. (Patient taking differently: Apply 1 application topically as needed. )  . vitamin B-12 (CYANOCOBALAMIN) 1000 MCG tablet Take 1,000 mcg by mouth daily.   No facility-administered encounter medications on file as of 03/08/2020.    Surgical History: Past Surgical History:  Procedure Laterality Date  . BREAST BIOPSY Right Age 43  . BREAST BIOPSY Left 07-21-12   Core biopsy showed atypical lobular hyperplasia and a 2 mm foci,  atypical ductal hyperplasia and a 1 mm foci. There was no evidence of in situ or invasive cancer  . BREAST CYST ASPIRATION  August 2013   inconclusive, with atypical ductal cells identified  . COLONOSCOPY  2002, 2005, 2009, 2012   polyps   . TOTAL ABDOMINAL HYSTERECTOMY W/ BILATERAL SALPINGOOPHORECTOMY  Age 61    Medical History: Past Medical History:  Diagnosis Date  . Anemia   . Breast screening, unspecified   . Eczema   . Family history of colonic polyps   . Lump or mass in breast   . Neoplasm of uncertain behavior of breast   . Personal history of colonic polyps   . Special screening for malignant neoplasms, colon   . Unspecified essential hypertension     Family History: Family History  Problem Relation Age of Onset  . Colon cancer Father   . Colon cancer Other        Paternal first cousin  . Hypertension Mother   . Anemia Mother    Review of Systems  Constitutional: Negative for chills, diaphoresis and fatigue.  HENT: Negative for ear pain, postnasal drip and sinus pressure.   Eyes: Negative for photophobia, discharge, redness, itching and visual disturbance.  Respiratory: Negative for cough, shortness of breath and wheezing.   Cardiovascular: Negative for chest pain, palpitations and leg swelling.  Gastrointestinal: Negative for abdominal pain, constipation, diarrhea, nausea and vomiting.  Genitourinary: Negative for dysuria and flank pain.  Musculoskeletal: Negative for arthralgias, back pain, gait problem and neck pain.  Skin: Negative for color change.  Allergic/Immunologic: Negative for environmental allergies and food allergies.  Neurological: Negative for dizziness and headaches.  Hematological: Does not bruise/bleed easily.  Psychiatric/Behavioral: Negative for agitation, behavioral problems (depression) and hallucinations.   Vital Signs: BP 120/68   Pulse 95   Temp (!) 97.1 F (36.2 C)   Resp 16   Ht 5\' 7"  (1.702 m)   Wt 145 lb (65.8 kg)   SpO2 98%    BMI 22.71 kg/m    Physical Exam Constitutional:      General: She is not in acute distress.    Appearance: She is well-developed. She is not diaphoretic.  HENT:     Head: Normocephalic and atraumatic.     Right Ear: External ear normal.     Left Ear: External ear normal.     Nose: Nose normal.     Mouth/Throat:     Pharynx: No oropharyngeal exudate.  Eyes:     General: No scleral icterus.       Right eye: No discharge.        Left eye: No discharge.     Conjunctiva/sclera: Conjunctivae normal.     Pupils: Pupils are equal, round, and reactive to light.  Neck:     Thyroid: No thyromegaly.     Vascular: No JVD.     Trachea: No tracheal deviation.  Cardiovascular:     Rate and Rhythm: Normal rate and regular rhythm.     Heart sounds: Normal heart sounds. No murmur heard.  No friction rub. No gallop.   Pulmonary:     Effort: Pulmonary effort is normal. No respiratory distress.     Breath sounds: Normal breath sounds. No stridor. No wheezing or rales.  Chest:     Chest wall: No tenderness.     Breasts:        Right: Normal.        Left: Normal.  Abdominal:     General: Bowel sounds are normal. There is no distension.     Palpations: Abdomen is soft. There is no mass.     Tenderness: There is no abdominal tenderness. There is no guarding or rebound.  Musculoskeletal:        General: No tenderness or deformity. Normal range of motion.     Cervical back: Normal range of motion and neck supple.  Lymphadenopathy:     Cervical: No cervical adenopathy.  Skin:    General: Skin is warm and dry.     Coloration: Skin is not pale.     Findings: No erythema or rash.  Neurological:     Mental Status: She is alert.     Cranial Nerves: No cranial nerve deficit.     Motor: No abnormal muscle tone.     Coordination: Coordination normal.     Deep Tendon Reflexes: Reflexes are normal and symmetric.  Psychiatric:        Behavior: Behavior normal.        Thought Content: Thought  content normal.        Judgment: Judgment normal.   LABS: Recent Results (from the past 2160 hour(s))  Iron and TIBC     Status: Abnormal   Collection Time: 12/29/19  8:57 AM  Result Value Ref Range   Iron 94 28 - 170 ug/dL   TIBC 265 250 - 450 ug/dL   Saturation Ratios 36 (H) 10.4 - 31.8 %   UIBC 171 ug/dL    Comment: Performed at [facility],  [place], [place] [address]  Ferritin     Status: Abnormal   Collection Time: 12/29/19  8:57 AM  Result Value Ref Range   Ferritin 470 (H) 11 - 307 ng/mL    Comment: Performed at [facility], [address] [place], [place] [address]  CBC with Differential     Status: Abnormal   Collection Time: 12/29/19  8:57 AM  Result Value Ref Range   WBC 5.1 4.0 - 10.5 K/uL   RBC 3.38 (L) 3.87 - 5.11 MIL/uL   Hemoglobin 9.1 (L) 12.0 - 15.0 g/dL   HCT 27.5 (L) 36 - 46 %   MCV 81.4 80.0 - 100.0 fL   MCH 26.9 26.0 - 34.0 pg   MCHC 33.1 30.0 - 36.0 g/dL   RDW 14.6 11.5 - 15.5 %   Platelets 216 150 - 400 K/uL   nRBC 0.4 (H) 0.0 - 0.2 %   Neutrophils Relative % 53 %   Neutro Abs 2.7 1.7 - 7.7 K/uL   Lymphocytes Relative 39 %   Lymphs Abs 2.0 0.7 - 4.0 K/uL   Monocytes Relative 5 %   Monocytes Absolute 0.3 0 - 1 K/uL   Eosinophils Relative 2 %   Eosinophils Absolute 0.1 0 - 0 K/uL   Basophils Relative 1 %   Basophils Absolute 0.0 0 - 0 K/uL   Immature Granulocytes 0 %   Abs Immature Granulocytes 0.01 0.00 - 0.07 K/uL    Comment: Performed at [facility], [address] [place], [place] [address]  Urinalysis, Routine w reflex microscopic     Status: Abnormal   Collection Time: 03/08/20  9:32 AM  Result Value Ref Range   Specific Gravity, UA 1.020 1.005 - 1.030   pH, UA 5.0 5.0 - 7.5   Color, UA Yellow Yellow   Appearance Ur Clear Clear   Leukocytes,UA 2+ (A) Negative   Protein,UA Negative Negative/Trace   Glucose, UA Negative Negative   Ketones, UA Trace (A) Negative   RBC, UA Negative Negative    Bilirubin, UA Negative Negative   Urobilinogen, Ur 0.2 0.2 - 1.0 mg/dL   Nitrite, UA Negative Negative   Microscopic Examination See below:     Comment: Microscopic was indicated and was performed.  Microscopic Examination     Status: Abnormal   Collection Time: 03/08/20  9:32 AM   URINE  Result Value Ref Range   WBC, UA 6-10 (A) 0 - 5 /hpf   RBC 0-2 0 - 2 /hpf   Epithelial Cells (non renal) 0-10 0 - 10 /hpf   Casts None seen None seen /lpf   Bacteria, UA Few None seen/Few   Assessment/Plan: 1. Encounter for general adult medical examination with abnormal findings - Updated labs, mammogram is reviewed, pt has recieved her covid 19 vaccine .  2. Essential hypertension, benign - Continue Medications   3. Iron deficiency anemia secondary to inadequate dietary iron intake - Per hematology hg is table   4. Gastroesophageal reflux disease without esophagitis - Stable   5. Dysuria - Urinalysis, Routine w reflex microscopic  General Counseling: Yahayra verbalizes understanding of the findings of todays visit and agrees with plan of treatment. I have discussed any further diagnostic evaluation that may be needed or ordered today. We also reviewed her medications today. she has been encouraged to call the office with any questions or concerns that should arise related to todays visit. Cardiac risk factor modification:  1. Control blood pressure.  2. Exercise as prescribed. 3. Follow low sodium, low fat diet. and low fat and low cholestrol diet. 4. Take ASA 81mg  once a day. 5. Restricted calories diet to lose weight. Orders Placed This Encounter  Procedures  . Microscopic Examination  . Urinalysis, Routine w reflex microscopic    Total time spent: 35 Minutes  Time spent includes review of chart, medications, test results, and follow up plan with the patient.     Lavera Guise, MD  Internal Medicine

## 2020-03-09 LAB — URINALYSIS, ROUTINE W REFLEX MICROSCOPIC
Bilirubin, UA: NEGATIVE
Glucose, UA: NEGATIVE
Nitrite, UA: NEGATIVE
Protein,UA: NEGATIVE
RBC, UA: NEGATIVE
Specific Gravity, UA: 1.02 (ref 1.005–1.030)
Urobilinogen, Ur: 0.2 mg/dL (ref 0.2–1.0)
pH, UA: 5 (ref 5.0–7.5)

## 2020-03-09 LAB — MICROSCOPIC EXAMINATION: Casts: NONE SEEN /lpf

## 2020-04-13 ENCOUNTER — Ambulatory Visit
Admission: EM | Admit: 2020-04-13 | Discharge: 2020-04-13 | Disposition: A | Discharge status: Home / Self Care | Payer: BC Managed Care – PPO | Attending: Family Medicine | Admitting: Family Medicine

## 2020-04-13 ENCOUNTER — Ambulatory Visit (INDEPENDENT_AMBULATORY_CARE_PROVIDER_SITE_OTHER): Payer: BC Managed Care – PPO

## 2020-04-13 ENCOUNTER — Encounter: Payer: Self-pay | Admitting: Emergency Medicine

## 2020-04-13 DIAGNOSIS — S20212A Contusion of left front wall of thorax, initial encounter: Secondary | ICD-10-CM | POA: Diagnosis not present

## 2020-04-13 DIAGNOSIS — R079 Chest pain, unspecified: Secondary | ICD-10-CM | POA: Diagnosis not present

## 2020-04-13 DIAGNOSIS — W19XXXA Unspecified fall, initial encounter: Secondary | ICD-10-CM

## 2020-04-13 MED ORDER — HYDROCODONE-ACETAMINOPHEN 5-325 MG PO TABS: ORAL_TABLET | ORAL | 0 refills | Status: DC

## 2020-04-13 NOTE — ED Provider Notes (Signed)
[facility]    CSN: [id number] Arrival date & time: 04/13/20  1238      History   Chief Complaint Chief Complaint  Patient presents with  . Fall  . Muscle Pain    left side rib area    HPI Laurie Gross is a 64 y.o. female.   64 yo female with a c/o left sided rib pain since falling this morning. States she tripped over a tree stump and landed hitting her left chest wall area. Denies loss of consciousness, difficulty breathing, hitting head.    Fall  Muscle Pain    Past Medical History:  Diagnosis Date  . Anemia   . Breast screening, unspecified   . Eczema   . Family history of colonic polyps   . Lump or mass in breast   . Neoplasm of uncertain behavior of breast   . Personal history of colonic polyps   . Special screening for malignant neoplasms, colon   . Unspecified essential hypertension     Patient Active Problem List   Diagnosis Date Noted  . Family history of colon cancer 04/08/2017  . Hx of colonic polyps 04/08/2017  . Cervical radiculopathy 12/15/2014  . Anemia 02/14/2014  . Vitamin D deficiency, unspecified 02/14/2014  . GERD (gastroesophageal reflux disease) 02/12/2014  . Hypertension 02/12/2014  . Pseudocyst of pancreas 10/01/2013    Past Surgical History:  Procedure Laterality Date  . BREAST BIOPSY Right Age 17  . BREAST BIOPSY Left 07-21-12   Core biopsy showed atypical lobular hyperplasia and a 2 mm foci, atypical ductal hyperplasia and a 1 mm foci. There was no evidence of in situ or invasive cancer  . BREAST CYST ASPIRATION  August 2013   inconclusive, with atypical ductal cells identified  . COLONOSCOPY  2002, 2005, 2009, 2012   polyps   . TOTAL ABDOMINAL HYSTERECTOMY W/ BILATERAL SALPINGOOPHORECTOMY  Age 59    OB History   No obstetric history on file.    Obstetric Comments  Age first menstrual cycle 52 Age last menstrual cycle 35- due to hysterectomy         Home Medications    Prior to Admission  medications   Medication Sig Start Date End Date Taking? Authorizing Provider  amLODipine (NORVASC) 10 MG tablet TAKE 1 TABLET(10 MG) BY MOUTH DAILY 01/12/20  Yes Lavera Guise, MD  Cholecalciferol (VITAMIN D-3) 5000 units TABS Take 5,000 Units by mouth.   Yes [provider]  folic acid (FOLVITE) 1 MG tablet TAKE 1 TABLET BY MOUTH EVERY DAY 03/04/20  Yes Sindy Guadeloupe, MD  lisinopril (ZESTRIL) 40 MG tablet TAKE 1 TABLET(40 MG) BY MOUTH DAILY 12/28/19  Yes Lavera Guise, MD  Multiple Vitamins-Iron (MULTIVITAMIN/IRON PO) Take 1 capsule by mouth daily.   Yes [provider]  niacin (NIASPAN) 500 MG CR tablet TAKE 1 TABLET(500 MG) BY MOUTH AT BEDTIME 12/03/19  Yes Scarboro, Audie Clear, NP  pantoprazole (PROTONIX) 40 MG tablet Take 1 tablet (40 mg total) by mouth daily. 11/06/17  Yes Lavera Guise, MD  Saccharomyces boulardii (FLORASTOR PO) Take 1 capsule by mouth 3 (three) times a week.   Yes [provider]  triamcinolone cream (KENALOG) 0.1 % Apply 1 application topically 2 (two) times daily. Patient taking differently: Apply 1 application topically as needed.  08/19/18  Yes Scarboro, Audie Clear, NP  vitamin B-12 (CYANOCOBALAMIN) 1000 MCG tablet Take 1,000 mcg by mouth daily.   Yes [provider]  Docusate  Sodium (STOOL SOFTENER LAXATIVE PO) Take by mouth as needed.     [provider]  HYDROcodone-acetaminophen (NORCO/VICODIN) 5-325 MG tablet 1-2 tabs po qd prn 04/13/20   Norval Gable, MD    Family History Family History  Problem Relation Age of Onset  . Colon cancer Father   . Colon cancer Other        Paternal first cousin  . Hypertension Mother   . Anemia Mother     Social History Social History   Tobacco Use  . Smoking status: Never Smoker  . Smokeless tobacco: Never Used  Substance Use Topics  . Alcohol use: Yes    Comment: occassionally  . Drug use: No     Allergies   Hydrochlorothiazide, Amoxicillin, Meloxicam, Penicillins, Simvastatin,  Sulfamethoxazole-trimethoprim, and Tramadol   Review of Systems Review of Systems   Physical Exam Triage Vital Signs ED Triage Vitals  Enc Vitals Group     BP 04/13/20 1247 131/64     Pulse Rate 04/13/20 1247 90     Resp 04/13/20 1247 18     Temp 04/13/20 1247 98.4 F (36.9 C)     Temp Source 04/13/20 1247 Oral     SpO2 04/13/20 1247 98 %     Weight 04/13/20 1246 145 lb 1 oz (65.8 kg)     Height 04/13/20 1246 5\' 7"  (1.702 m)     Head Circumference --      Peak Flow --      Pain Score 04/13/20 1245 6     Pain Loc --      Pain Edu? --      Excl. in [place]? --    No data found.  Updated Vital Signs BP 131/64 (BP Location: Right Arm)   Pulse 90   Temp 98.4 F (36.9 C) (Oral)   Resp 18   Ht 5\' 7"  (1.702 m)   Wt 65.8 kg   SpO2 98%   BMI 22.72 kg/m   Visual Acuity Right Eye Distance:   Left Eye Distance:   Bilateral Distance:    Right Eye Near:   Left Eye Near:    Bilateral Near:     Physical Exam Vitals and nursing note reviewed.  Constitutional:      General: She is not in acute distress.    Appearance: She is not toxic-appearing or diaphoretic.  HENT:     Head: Atraumatic.     Right Ear: Tympanic membrane, ear canal and external ear normal.     Left Ear: Tympanic membrane, ear canal and external ear normal.  Eyes:     Extraocular Movements: Extraocular movements intact.     Pupils: Pupils are equal, round, and reactive to light.  Cardiovascular:     Rate and Rhythm: Normal rate and regular rhythm.  Pulmonary:     Effort: Pulmonary effort is normal. No respiratory distress.     Breath sounds: Normal breath sounds.  Chest:     Chest wall: Tenderness (left mid ribs area) present.  Musculoskeletal:     Cervical back: Neck supple. No tenderness.  Neurological:     Mental Status: She is alert.      UC Treatments / Results  Labs (all labs ordered are listed, but only abnormal results are displayed) Labs Reviewed - No data to  display  EKG   Radiology DG Ribs Unilateral W/Chest Left  Result Date: 04/13/2020 CLINICAL DATA:  Fall.  Left chest pain EXAM: LEFT RIBS AND CHEST - 3+ VIEW COMPARISON:  09/04/2017 FINDINGS: No fracture or other bone lesions are seen involving the ribs. There is no evidence of pneumothorax or pleural effusion. Both lungs are clear. Heart size and mediastinal contours are within normal limits. IMPRESSION: Negative. Electronically Signed   By: Franchot Gallo M.D.   On: 04/13/2020 13:20    Procedures Procedures (including critical care time)  Medications Ordered in UC Medications - No data to display  Initial Impression / Assessment and Plan / UC Course  I have reviewed the triage vital signs and the nursing notes.  Pertinent labs & imaging results that were available during my care of the patient were reviewed by me and considered in my medical decision making (see chart for details).      Final Clinical Impressions(s) / UC Diagnoses   Final diagnoses:  Contusion of rib on left side, initial encounter  Fall, initial encounter     Discharge Instructions     Rest, ice, over the counter anti-inflammatories (advil, aleve, etc)    ED Prescriptions    Medication Sig Dispense Auth. Provider   HYDROcodone-acetaminophen (NORCO/VICODIN) 5-325 MG tablet 1-2 tabs po qd prn 6 tablet Tishara Pizano, Linward Foster, MD      1. x-ray results and diagnosis reviewed with patient 2. rx as per orders above; reviewed possible side effects, interactions, risks and benefits  3. Recommend supportive treatment as above 4. Follow-up prn if symptoms worsen or don't improve   I have reviewed the PDMP during this encounter.   Norval Gable, MD 04/13/20 [phone]

## 2020-04-13 NOTE — ED Triage Notes (Signed)
Patient states she tripped over a tree stump this morning and fell injuring the left side of her rib area.

## 2020-04-13 NOTE — Discharge Instructions (Signed)
Rest, ice, over the counter anti-inflammatories (advil, aleve, etc)

## 2020-06-30 ENCOUNTER — Other Ambulatory Visit: Payer: Self-pay

## 2020-06-30 ENCOUNTER — Inpatient Hospital Stay (HOSPITAL_BASED_OUTPATIENT_CLINIC_OR_DEPARTMENT_OTHER): Payer: BC Managed Care – PPO | Admitting: Oncology

## 2020-06-30 ENCOUNTER — Inpatient Hospital Stay: Payer: BC Managed Care – PPO | Attending: Oncology

## 2020-06-30 ENCOUNTER — Encounter: Payer: Self-pay | Admitting: Oncology

## 2020-06-30 VITALS — BP 130/57 | HR 75 | Temp 98.7°F | Resp 20 | Wt 152.2 lb

## 2020-06-30 DIAGNOSIS — Z800 Family history of malignant neoplasm of digestive organs: Secondary | ICD-10-CM | POA: Insufficient documentation

## 2020-06-30 DIAGNOSIS — I10 Essential (primary) hypertension: Secondary | ICD-10-CM | POA: Insufficient documentation

## 2020-06-30 DIAGNOSIS — D649 Anemia, unspecified: Secondary | ICD-10-CM

## 2020-06-30 DIAGNOSIS — K219 Gastro-esophageal reflux disease without esophagitis: Secondary | ICD-10-CM | POA: Diagnosis not present

## 2020-06-30 DIAGNOSIS — E785 Hyperlipidemia, unspecified: Secondary | ICD-10-CM | POA: Diagnosis not present

## 2020-06-30 DIAGNOSIS — E538 Deficiency of other specified B group vitamins: Secondary | ICD-10-CM | POA: Diagnosis not present

## 2020-06-30 LAB — VITAMIN B12: Vitamin B-12: 669 pg/mL (ref 180–914)

## 2020-06-30 LAB — CBC WITH DIFFERENTIAL/PLATELET
Abs Immature Granulocytes: 0.02 10*3/uL (ref 0.00–0.07)
Basophils Absolute: 0 10*3/uL (ref 0.0–0.1)
Basophils Relative: 1 %
Eosinophils Absolute: 0.1 10*3/uL (ref 0.0–0.5)
Eosinophils Relative: 2 %
HCT: 27.9 % — ABNORMAL LOW (ref 36.0–46.0)
Hemoglobin: 9 g/dL — ABNORMAL LOW (ref 12.0–15.0)
Immature Granulocytes: 0 %
Lymphocytes Relative: 32 %
Lymphs Abs: 1.6 10*3/uL (ref 0.7–4.0)
MCH: 25.7 pg — ABNORMAL LOW (ref 26.0–34.0)
MCHC: 32.3 g/dL (ref 30.0–36.0)
MCV: 79.7 fL — ABNORMAL LOW (ref 80.0–100.0)
Monocytes Absolute: 0.3 10*3/uL (ref 0.1–1.0)
Monocytes Relative: 6 %
Neutro Abs: 2.9 10*3/uL (ref 1.7–7.7)
Neutrophils Relative %: 59 %
Platelets: 274 10*3/uL (ref 150–400)
RBC: 3.5 MIL/uL — ABNORMAL LOW (ref 3.87–5.11)
RDW: 14.9 % (ref 11.5–15.5)
WBC: 4.9 10*3/uL (ref 4.0–10.5)
nRBC: 0 % (ref 0.0–0.2)

## 2020-06-30 LAB — IRON AND TIBC
Iron: 85 ug/dL (ref 28–170)
Saturation Ratios: 28 % (ref 10.4–31.8)
TIBC: 308 ug/dL (ref 250–450)
UIBC: 223 ug/dL

## 2020-06-30 LAB — FERRITIN: Ferritin: 435 ng/mL — ABNORMAL HIGH (ref 11–307)

## 2020-07-01 ENCOUNTER — Telehealth: Payer: Self-pay | Admitting: *Deleted

## 2020-07-01 NOTE — Telephone Encounter (Signed)
Called patient to let her know that the bone marrow biopsy has been scheduled for October 12 [facility] 7: 30 a.m.Marland Kitchen  Patient told not to eat or drink anything after midnight the night before and if she has to take medication she should take him with just a sip of water enough to get them down.  Informed her that she has to have a driver to bring her and come get her.  Also made an appointment for her to come back and see [place] in the office on 10/19 at 9: 45 to get results. She is agreeable to plan

## 2020-07-01 NOTE — Progress Notes (Signed)
Hematology/Oncology Consult note [facility]  [phone] [phone]  Patient Care Team: Lavera Guise, MD as PCP - General (Internal Medicine) Bary Castilla Forest Gleason, MD (General Surgery) Sindy Guadeloupe, MD as Consulting Physician (Oncology)   Name of the patient: Laurie Gross  [id number]  02/04/1956   Date of visit: 07/01/20  Diagnosis-normocytic anemia of unclear etiology  Chief complaint/ Reason for visit-routine follow-up of anemia  Heme/Onc history: patient is a 64 year old African-American female with a past medical history significant for hypertension, hyperlipidemia and GERD among other medical problems. She has been referred to [place] for evaluation and management of anemia as well as elevated ferritin.  On review of outside labs patient has had chronic anemia and her hemoglobin has been between 9-10 since 2015. And her MCV ranges between 74-80. Hemoglobin electrophoresis in the past has been normal. Most recent CBC from 11/06/2017 showed white count of 4.2, H&H of 8.9/28.2 with an MCV of 84 and a platelet count of 314. TSH was within normal limits. CMP was within normal limits. Recent iron studies from 11/06/2017 showed elevated ferritin of 610. Iron saturation, serum iron and TIBC were normal. Of note patient has had an elevated ferritin since July 2017 that has been gradually increasing from 335 11/04/1966 to 662 in December 2018. Transferrin saturation has always been less than 35%. LFTs have always been normal.  Results of blood work from 12/09/2017 were as follows: CBC showed white count of 5.5, H&H of 10.1/30.7 with an MCV of 82.1 and a platelet count of 356. Differential was normal. CMP was essentially normal except for a mildly low potassium of 3.3 and elevated blood sugar of 150. LFTs and creatinine was normal serum calcium was normal. Ferritin levels were mildly elevated at 443 lowercompared to a prior value of 610. Iron  studies were within normal limits. Folate was mildly low at 5.3. B12 levels were low at 192. ESR was normal and ANA comprehensive panel was negative. Multiple myeloma panel revealed no monoclonal protein. Polyclonal gammopathy was noted. Serum free light chains revealed mildly elevated kappa of 24 with a normal kappa lambda ratio 1.2. Reticulocyte count was low normal at 1.8.   Interval history-patient reports feeling well and denies any complaints at this time  ECOG PS- 1 Pain scale- 0   Review of systems- Review of Systems  Constitutional: Negative for chills, fever, malaise/fatigue and weight loss.  HENT: Negative for congestion, ear discharge and nosebleeds.   Eyes: Negative for blurred vision.  Respiratory: Negative for cough, hemoptysis, sputum production, shortness of breath and wheezing.   Cardiovascular: Negative for chest pain, palpitations, orthopnea and claudication.  Gastrointestinal: Negative for abdominal pain, blood in stool, constipation, diarrhea, heartburn, melena, nausea and vomiting.  Genitourinary: Negative for dysuria, flank pain, frequency, hematuria and urgency.  Musculoskeletal: Negative for back pain, joint pain and myalgias.  Skin: Negative for rash.  Neurological: Negative for dizziness, tingling, focal weakness, seizures, weakness and headaches.  Endo/Heme/Allergies: Does not bruise/bleed easily.  Psychiatric/Behavioral: Negative for depression and suicidal ideas. The patient does not have insomnia.      Allergies  Allergen Reactions  . Hydrochlorothiazide Other (See Comments)    History of pancreatitis  . Amoxicillin Nausea Only  . Meloxicam Other (See Comments)  . Penicillins Nausea Only  . Simvastatin Nausea Only  . Sulfamethoxazole-Trimethoprim Nausea Only  . Tramadol Nausea Only     Past Medical History:  Diagnosis Date  . Anemia   . Breast screening,  unspecified   . Eczema   . Family history of colonic polyps   . Lump or mass in  breast   . Neoplasm of uncertain behavior of breast   . Personal history of colonic polyps   . Special screening for malignant neoplasms, colon   . Unspecified essential hypertension      Past Surgical History:  Procedure Laterality Date  . BREAST BIOPSY Right Age 49  . BREAST BIOPSY Left 07-21-12   Core biopsy showed atypical lobular hyperplasia and a 2 mm foci, atypical ductal hyperplasia and a 1 mm foci. There was no evidence of in situ or invasive cancer  . BREAST CYST ASPIRATION  August 2013   inconclusive, with atypical ductal cells identified  . COLONOSCOPY  2002, 2005, 2009, 2012   polyps   . TOTAL ABDOMINAL HYSTERECTOMY W/ BILATERAL SALPINGOOPHORECTOMY  Age 58    Social History   Socioeconomic History  . Marital status: Married    Spouse name: Not on file  . Number of children: Not on file  . Years of education: Not on file  . Highest education level: Not on file  Occupational History  . Not on file  Tobacco Use  . Smoking status: Never Smoker  . Smokeless tobacco: Never Used  Substance and Sexual Activity  . Alcohol use: Yes    Comment: occassionally  . Drug use: No  . Sexual activity: Not on file  Other Topics Concern  . Not on file  Social History Narrative  . Not on file   Social Determinants of Health   Financial Resource Strain:   . Difficulty of Paying Living Expenses: Not on file  Food Insecurity:   . Worried About Charity fundraiser in the Last Year: Not on file  . Ran Out of Food in the Last Year: Not on file  Transportation Needs:   . Lack of Transportation (Medical): Not on file  . Lack of Transportation (Non-Medical): Not on file  Physical Activity:   . Days of Exercise per Week: Not on file  . Minutes of Exercise per Session: Not on file  Stress:   . Feeling of Stress : Not on file  Social Connections:   . Frequency of Communication with Friends and Family: Not on file  . Frequency of Social Gatherings with Friends and Family: Not on  file  . Attends Religious Services: Not on file  . Active Member of Clubs or Organizations: Not on file  . Attends Archivist Meetings: Not on file  . Marital Status: Not on file  Intimate Partner Violence:   . Fear of Current or Ex-Partner: Not on file  . Emotionally Abused: Not on file  . Physically Abused: Not on file  . Sexually Abused: Not on file    Family History  Problem Relation Age of Onset  . Colon cancer Father   . Colon cancer Other        Paternal first cousin  . Hypertension Mother   . Anemia Mother      Current Outpatient Medications:  .  amLODipine (NORVASC) 10 MG tablet, TAKE 1 TABLET(10 MG) BY MOUTH DAILY, Disp: 30 tablet, Rfl: 2 .  Cholecalciferol (VITAMIN D-3) 5000 units TABS, Take 5,000 Units by mouth., Disp: , Rfl:  .  Docusate Sodium (STOOL SOFTENER LAXATIVE PO), Take by mouth as needed. , Disp: , Rfl:  .  folic acid (FOLVITE) 1 MG tablet, TAKE 1 TABLET BY MOUTH EVERY DAY, Disp: 90 tablet, Rfl:  1 .  HYDROcodone-acetaminophen (NORCO/VICODIN) 5-325 MG tablet, 1-2 tabs po qd prn, Disp: 6 tablet, Rfl: 0 .  lisinopril (ZESTRIL) 40 MG tablet, TAKE 1 TABLET(40 MG) BY MOUTH DAILY, Disp: 30 tablet, Rfl: 3 .  Multiple Vitamins-Iron (MULTIVITAMIN/IRON PO), Take 1 capsule by mouth daily., Disp: , Rfl:  .  niacin (NIASPAN) 500 MG CR tablet, TAKE 1 TABLET(500 MG) BY MOUTH AT BEDTIME, Disp: 30 tablet, Rfl: 3 .  pantoprazole (PROTONIX) 40 MG tablet, Take 1 tablet (40 mg total) by mouth daily., Disp: 30 tablet, Rfl: 12 .  Saccharomyces boulardii (FLORASTOR PO), Take 1 capsule by mouth 3 (three) times a week., Disp: , Rfl:  .  triamcinolone cream (KENALOG) 0.1 %, Apply 1 application topically 2 (two) times daily. (Patient taking differently: Apply 1 application topically as needed. ), Disp: 30 g, Rfl: 0 .  vitamin B-12 (CYANOCOBALAMIN) 1000 MCG tablet, Take 1,000 mcg by mouth daily., Disp: , Rfl:   Physical exam:  Vitals:   06/30/20 0853  BP: (!) 130/57  Pulse:  75  Resp: 20  Temp: 98.7 F (37.1 C)  SpO2: 100%  Weight: 152 lb 3.2 oz (69 kg)   Physical Exam Constitutional:      General: She is not in acute distress. Cardiovascular:     Rate and Rhythm: Normal rate and regular rhythm.     Heart sounds: Normal heart sounds.  Pulmonary:     Effort: Pulmonary effort is normal.     Breath sounds: Normal breath sounds.  Abdominal:     General: Bowel sounds are normal.     Palpations: Abdomen is soft.  Lymphadenopathy:     Comments: No palpable cervical, supraclavicular, axillary or inguinal adenopathy   Skin:    General: Skin is warm and dry.  Neurological:     Mental Status: She is alert and oriented to person, place, and time.      CMP Latest Ref Rng & Units 08/17/2019  Glucose 65 - 99 mg/dL 88  BUN 8 - 27 mg/dL 8  Creatinine 0.57 - 1.00 mg/dL 0.75  Sodium 134 - 144 mmol/L 142  Potassium 3.5 - 5.2 mmol/L 3.6  Chloride 96 - 106 mmol/L 100  CO2 20 - 29 mmol/L 22  Calcium 8.7 - 10.3 mg/dL 8.8  Total Protein 6.0 - 8.5 g/dL 7.5  Total Bilirubin 0.0 - 1.2 mg/dL 0.7  Alkaline Phos 39 - 117 IU/L 64  AST 0 - 40 IU/L 63(H)  ALT 0 - 32 IU/L 22   CBC Latest Ref Rng & Units 06/30/2020  WBC 4.0 - 10.5 K/uL 4.9  Hemoglobin 12.0 - 15.0 g/dL 9.0(L)  Hematocrit 36 - 46 % 27.9(L)  Platelets 150 - 400 K/uL 274      Assessment and plan- Patient is a 64 y.o. female Is here for routine follow-up of anemia   I have been seeing this patient over the last 2 years and her hemoglobin has been mostly around 10.  Over the last 1 year it has gradually drifted down to 9 although she has microcytosis iron studies are not indicated of iron deficiency.  B12 levels are normal.  She has undergone extensive anemia work-up in the past in 2019 which did not reveal any significant etiology.  Given the gradually drifting down of her hemoglobin I am inclined to do a bone marrow biopsy at this time to rule out any primary bone marrow process contributing to her anemia.   She remains fairly asymptomatic at this time.  She has no known kidney disease  I will check a CBC with differential, CMP, reticulocyte count, LDH and haptoglobin on the day of her bone marrow biopsy as well   Visit Diagnosis 1. Normocytic anemia      Dr. Randa Evens, MD, MPH [facility] [phone] 07/01/2020 2:44 PM

## 2020-07-06 ENCOUNTER — Other Ambulatory Visit: Payer: Self-pay | Admitting: Internal Medicine

## 2020-07-06 DIAGNOSIS — I10 Essential (primary) hypertension: Secondary | ICD-10-CM

## 2020-07-07 NOTE — Progress Notes (Signed)
Patient on schedule for BMB 07/12/2020, spoke with patient on phone, made aware to be here @ 0730, NPO after MN, as well as driver post procedure after Discharge, stated understanding.

## 2020-07-08 ENCOUNTER — Other Ambulatory Visit: Payer: Self-pay | Admitting: Radiology

## 2020-07-11 ENCOUNTER — Other Ambulatory Visit: Payer: Self-pay | Admitting: Student

## 2020-07-12 ENCOUNTER — Ambulatory Visit
Admission: RE | Admit: 2020-07-12 | Discharge: 2020-07-12 | Disposition: A | Discharge status: Home / Self Care | Payer: BC Managed Care – PPO | Source: Ambulatory Visit | Attending: Oncology | Admitting: Oncology

## 2020-07-12 ENCOUNTER — Other Ambulatory Visit: Payer: Self-pay

## 2020-07-12 DIAGNOSIS — D72822 Plasmacytosis: Secondary | ICD-10-CM | POA: Diagnosis not present

## 2020-07-12 DIAGNOSIS — Z880 Allergy status to penicillin: Secondary | ICD-10-CM | POA: Diagnosis not present

## 2020-07-12 DIAGNOSIS — Z881 Allergy status to other antibiotic agents status: Secondary | ICD-10-CM | POA: Insufficient documentation

## 2020-07-12 DIAGNOSIS — Z888 Allergy status to other drugs, medicaments and biological substances status: Secondary | ICD-10-CM | POA: Diagnosis not present

## 2020-07-12 DIAGNOSIS — D709 Neutropenia, unspecified: Secondary | ICD-10-CM | POA: Diagnosis not present

## 2020-07-12 DIAGNOSIS — I10 Essential (primary) hypertension: Secondary | ICD-10-CM | POA: Diagnosis not present

## 2020-07-12 DIAGNOSIS — Z885 Allergy status to narcotic agent status: Secondary | ICD-10-CM | POA: Diagnosis not present

## 2020-07-12 DIAGNOSIS — D649 Anemia, unspecified: Secondary | ICD-10-CM | POA: Insufficient documentation

## 2020-07-12 DIAGNOSIS — Z79899 Other long term (current) drug therapy: Secondary | ICD-10-CM | POA: Diagnosis not present

## 2020-07-12 DIAGNOSIS — D6489 Other specified anemias: Secondary | ICD-10-CM | POA: Diagnosis not present

## 2020-07-12 LAB — CBC WITH DIFFERENTIAL/PLATELET
Abs Immature Granulocytes: 0.01 10*3/uL (ref 0.00–0.07)
Basophils Absolute: 0.1 10*3/uL (ref 0.0–0.1)
Basophils Relative: 1 %
Eosinophils Absolute: 0.1 10*3/uL (ref 0.0–0.5)
Eosinophils Relative: 3 %
HCT: 29.7 % — ABNORMAL LOW (ref 36.0–46.0)
Hemoglobin: 9.6 g/dL — ABNORMAL LOW (ref 12.0–15.0)
Immature Granulocytes: 0 %
Lymphocytes Relative: 53 %
Lymphs Abs: 2.3 10*3/uL (ref 0.7–4.0)
MCH: 26 pg (ref 26.0–34.0)
MCHC: 32.3 g/dL (ref 30.0–36.0)
MCV: 80.5 fL (ref 80.0–100.0)
Monocytes Absolute: 0.3 10*3/uL (ref 0.1–1.0)
Monocytes Relative: 6 %
Neutro Abs: 1.6 10*3/uL — ABNORMAL LOW (ref 1.7–7.7)
Neutrophils Relative %: 37 %
Platelets: 272 10*3/uL (ref 150–400)
RBC: 3.69 MIL/uL — ABNORMAL LOW (ref 3.87–5.11)
RDW: 15.2 % (ref 11.5–15.5)
WBC: 4.3 10*3/uL (ref 4.0–10.5)
nRBC: 0 % (ref 0.0–0.2)

## 2020-07-12 LAB — RETICULOCYTES
Immature Retic Fract: 17.3 % — ABNORMAL HIGH (ref 2.3–15.9)
RBC.: 3.58 MIL/uL — ABNORMAL LOW (ref 3.87–5.11)
Retic Count, Absolute: 62.7 10*3/uL (ref 19.0–186.0)
Retic Ct Pct: 1.8 % (ref 0.4–3.1)

## 2020-07-12 MED ORDER — MIDAZOLAM HCL 5 MG/5ML IJ SOLN
INTRAMUSCULAR | Status: AC | PRN
  Administered 2020-07-12: 1 mg via INTRAVENOUS

## 2020-07-12 MED ORDER — MIDAZOLAM HCL 2 MG/2ML IJ SOLN
INTRAMUSCULAR | Status: AC | PRN
  Administered 2020-07-12: 1 mg via INTRAVENOUS

## 2020-07-12 MED ORDER — FENTANYL CITRATE (PF) 100 MCG/2ML IJ SOLN
INTRAMUSCULAR | Status: AC | PRN
  Administered 2020-07-12 (×2): 50 ug via INTRAVENOUS

## 2020-07-12 MED ORDER — SODIUM CHLORIDE 0.9 % IV SOLN: INTRAVENOUS | Status: DC

## 2020-07-12 NOTE — Discharge Instructions (Signed)
Bone Marrow Aspiration and Bone Marrow Biopsy, Adult, Care After This sheet gives you information about how to care for yourself after your procedure. Your health care provider may also give you more specific instructions. If you have problems or questions, contact your health care provider. What can I expect after the procedure? After the procedure, it is common to have:  Mild pain and tenderness.  Swelling.  Bruising. Follow these instructions at home: Puncture site care   Follow instructions from your health care provider about how to take care of the puncture site. Make sure you: ? Wash your hands with soap and water before and after you change your bandage (dressing). If soap and water are not available, use hand sanitizer. ? Change your dressing as told by your health care provider.  Check your puncture site every day for signs of infection. Check for: ? More redness, swelling, or pain. ? Fluid or blood. ? Warmth. ? Pus or a bad smell. Activity  Return to your normal activities as told by your health care provider. Ask your health care provider what activities are safe for you.  Do not lift anything that is heavier than 10 lb (4.5 kg), or the limit that you are told, until your health care provider says that it is safe.  Do not drive for 24 hours if you were given a sedative during your procedure. General instructions   Take over-the-counter and prescription medicines only as told by your health care provider.  Do not take baths, swim, or use a hot tub until your health care provider approves. Ask your health care provider if you may take showers. You may only be allowed to take sponge baths.  If directed, put ice on the affected area. To do this: ? Put ice in a plastic bag. ? Place a towel between your skin and the bag. ? Leave the ice on for 20 minutes, 2-3 times a day.  Keep all follow-up visits as told by your health care provider. This is important. Contact a  health care provider if:  Your pain is not controlled with medicine.  You have a fever.  You have more redness, swelling, or pain around the puncture site.  You have fluid or blood coming from the puncture site.  Your puncture site feels warm to the touch.  You have pus or a bad smell coming from the puncture site. Summary  After the procedure, it is common to have mild pain, tenderness, swelling, and bruising.  Follow instructions from your health care provider about how to take care of the puncture site and what activities are safe for you.  Take over-the-counter and prescription medicines only as told by your health care provider.  Contact a health care provider if you have any signs of infection, such as fluid or blood coming from the puncture site. This information is not intended to replace advice given to you by your health care provider. Make sure you discuss any questions you have with your health care provider. Document Revised: 02/03/2019 Document Reviewed: 02/03/2019 Elsevier Patient Education  2020 [employer]  

## 2020-07-12 NOTE — Consult Note (Signed)
Chief Complaint: Normocytic anemia  Referring Physician(s): Rao,Archana C  Patient Status: [facility] - Out-pt  History of Present Illness: Laurie Gross is a 64 y.o. female with no significant past medical history who presents today for CT-guided bone marrow biopsy for tissue diagnostic evaluation for evaluation of normocytic anemia.  Patient remains asymptomatic.  Specifically, no chest pain, shortness of breath, fever or chills.  No change in appetite or energy level.    Past Medical History:  Diagnosis Date  . Anemia   . Breast screening, unspecified   . Eczema   . Family history of colonic polyps   . Lump or mass in breast   . Neoplasm of uncertain behavior of breast   . Personal history of colonic polyps   . Special screening for malignant neoplasms, colon   . Unspecified essential hypertension     Past Surgical History:  Procedure Laterality Date  . BREAST BIOPSY Right Age 35  . BREAST BIOPSY Left 07-21-12   Core biopsy showed atypical lobular hyperplasia and a 2 mm foci, atypical ductal hyperplasia and a 1 mm foci. There was no evidence of in situ or invasive cancer  . BREAST CYST ASPIRATION  August 2013   inconclusive, with atypical ductal cells identified  . COLONOSCOPY  2002, 2005, 2009, 2012   polyps   . TOTAL ABDOMINAL HYSTERECTOMY W/ BILATERAL SALPINGOOPHORECTOMY  Age 46    Allergies: Hydrochlorothiazide, Amoxicillin, Meloxicam, Penicillins, Simvastatin, Sulfamethoxazole-trimethoprim, and Tramadol  Medications: Prior to Admission medications   Medication Sig Start Date End Date Taking? Authorizing Provider  amLODipine (NORVASC) 10 MG tablet TAKE 1 TABLET(10 MG) BY MOUTH DAILY 01/12/20  Yes Lavera Guise, MD  Cholecalciferol (VITAMIN D-3) 5000 units TABS Take 5,000 Units by mouth.   Yes [provider]  Docusate Sodium (STOOL SOFTENER LAXATIVE PO) Take by mouth as needed.    Yes [provider]  folic acid (FOLVITE) 1 MG tablet TAKE 1  TABLET BY MOUTH EVERY DAY 03/04/20  Yes Sindy Guadeloupe, MD  HYDROcodone-acetaminophen (NORCO/VICODIN) 5-325 MG tablet 1-2 tabs po qd prn 04/13/20  Yes Conty, [place], MD  lisinopril (ZESTRIL) 40 MG tablet TAKE 1 TABLET(40 MG) BY MOUTH DAILY 07/06/20  Yes Lavera Guise, MD  Multiple Vitamins-Iron (MULTIVITAMIN/IRON PO) Take 1 capsule by mouth daily.   Yes [provider]  niacin (NIASPAN) 500 MG CR tablet TAKE 1 TABLET(500 MG) BY MOUTH AT BEDTIME 12/03/19  Yes Scarboro, Audie Clear, NP  pantoprazole (PROTONIX) 40 MG tablet Take 1 tablet (40 mg total) by mouth daily. 11/06/17  Yes Lavera Guise, MD  Saccharomyces boulardii (FLORASTOR PO) Take 1 capsule by mouth 3 (three) times a week.   Yes [provider]  vitamin B-12 (CYANOCOBALAMIN) 1000 MCG tablet Take 1,000 mcg by mouth daily.   Yes [provider]  triamcinolone cream (KENALOG) 0.1 % Apply 1 application topically 2 (two) times daily. Patient taking differently: Apply 1 application topically as needed.  08/19/18   Kendell Bane, NP     Family History  Problem Relation Age of Onset  . Colon cancer Father   . Colon cancer Other        Paternal first cousin  . Hypertension Mother   . Anemia Mother     Social History   Socioeconomic History  . Marital status: Married    Spouse name: Not on file  . Number of children: Not on file  . Years of education: Not on file  . Highest  education level: Not on file  Occupational History  . Not on file  Tobacco Use  . Smoking status: Never Smoker  . Smokeless tobacco: Never Used  Substance and Sexual Activity  . Alcohol use: Yes    Comment: occassionally  . Drug use: No  . Sexual activity: Not on file  Other Topics Concern  . Not on file  Social History Narrative  . Not on file   Social Determinants of Health   Financial Resource Strain:   . Difficulty of Paying Living Expenses: Not on file  Food Insecurity:   . Worried About Charity fundraiser in the Last Year:  Not on file  . Ran Out of Food in the Last Year: Not on file  Transportation Needs:   . Lack of Transportation (Medical): Not on file  . Lack of Transportation (Non-Medical): Not on file  Physical Activity:   . Days of Exercise per Week: Not on file  . Minutes of Exercise per Session: Not on file  Stress:   . Feeling of Stress : Not on file  Social Connections:   . Frequency of Communication with Friends and Family: Not on file  . Frequency of Social Gatherings with Friends and Family: Not on file  . Attends Religious Services: Not on file  . Active Member of Clubs or Organizations: Not on file  . Attends Archivist Meetings: Not on file  . Marital Status: Not on file    ECOG Status: 0 - Asymptomatic  Review of Systems: A 12 point ROS discussed and pertinent positives are indicated in the HPI above.  All other systems are negative.  Review of Systems  Vital Signs: There were no vitals taken for this visit.  Physical Exam  Imaging: No results found.  Labs:  CBC: Recent Labs    12/29/19 0857 06/30/20 0838  WBC 5.1 4.9  HGB 9.1* 9.0*  HCT 27.5* 27.9*  PLT 216 274    COAGS: No results for input(s): INR, APTT in the last 8760 hours.  BMP: Recent Labs    08/17/19 0807  NA 142  K 3.6  CL 100  CO2 22  GLUCOSE 88  BUN 8  CALCIUM 8.8  CREATININE 0.75  GFRNONAA 86  GFRAA 99    LIVER FUNCTION TESTS: Recent Labs    08/17/19 0807  BILITOT 0.7  AST 63*  ALT 22  ALKPHOS 64  PROT 7.5  ALBUMIN 4.4    TUMOR MARKERS: No results for input(s): AFPTM, CEA, CA199, CHROMGRNA in the last 8760 hours.  Assessment and Plan:  Laurie Gross is a 64 y.o. female with no significant past medical history who presents today for CT-guided bone marrow biopsy for tissue diagnostic evaluation for evaluation of normocytic anemia.  Patient remains asymptomatic.    Risks and benefits of CT guided BM Bx was discussed with the patient and/or patient's family  including, but not limited to bleeding, infection, damage to adjacent structures or low yield requiring additional tests.  All of the questions were answered and there is agreement to proceed.  Consent signed and in chart.    Thank you for this interesting consult.  I greatly enjoyed meeting BRISA AUTH and look forward to participating in their care.  A copy of this report was sent to the requesting provider on this date.  Electronically Signed: Sandi Mariscal, MD 07/12/2020, 7:56 AM   I spent a total of 15 Minutes in face to face in clinical consultation, greater  than 50% of which was counseling/coordinating care for CT guided BM Bx.

## 2020-07-12 NOTE — Procedures (Signed)
Pre-procedure Diagnosis: Anemia of uncertain etiology Post-procedure Diagnosis: Same  Technically successful CT guided bone marrow aspiration and biopsy of left iliac crest.   Complications: None Immediate  EBL: None  Signed: Sandi Mariscal Pager: [phone] 07/12/2020, 9:26 AM

## 2020-07-13 ENCOUNTER — Other Ambulatory Visit: Payer: Self-pay

## 2020-07-13 LAB — KAPPA/LAMBDA LIGHT CHAINS
Kappa free light chain: 39.2 mg/L — ABNORMAL HIGH (ref 3.3–19.4)
Kappa, lambda light chain ratio: 1.57 (ref 0.26–1.65)
Lambda free light chains: 24.9 mg/L (ref 5.7–26.3)

## 2020-07-13 LAB — ERYTHROPOIETIN: Erythropoietin: 17.7 m[IU]/mL (ref 2.6–18.5)

## 2020-07-14 LAB — MULTIPLE MYELOMA PANEL, SERUM
Albumin SerPl Elph-Mcnc: 3.9 g/dL (ref 2.9–4.4)
Albumin/Glob SerPl: 1.2 (ref 0.7–1.7)
Alpha 1: 0.1 g/dL (ref 0.0–0.4)
Alpha2 Glob SerPl Elph-Mcnc: 0.8 g/dL (ref 0.4–1.0)
B-Globulin SerPl Elph-Mcnc: 1.2 g/dL (ref 0.7–1.3)
Gamma Glob SerPl Elph-Mcnc: 1.3 g/dL (ref 0.4–1.8)
Globulin, Total: 3.4 g/dL (ref 2.2–3.9)
IgA: 400 mg/dL — ABNORMAL HIGH (ref 87–352)
IgG (Immunoglobin G), Serum: 1139 mg/dL (ref 586–1602)
IgM (Immunoglobulin M), Srm: 276 mg/dL — ABNORMAL HIGH (ref 26–217)
Total Protein ELP: 7.3 g/dL (ref 6.0–8.5)

## 2020-07-19 ENCOUNTER — Other Ambulatory Visit: Payer: Self-pay

## 2020-07-19 ENCOUNTER — Inpatient Hospital Stay: Payer: BC Managed Care – PPO | Attending: Oncology | Admitting: Oncology

## 2020-07-19 ENCOUNTER — Encounter: Payer: Self-pay | Admitting: Oncology

## 2020-07-19 VITALS — BP 142/76 | HR 119 | Temp 98.9°F | Resp 16 | Ht 67.0 in | Wt 163.0 lb

## 2020-07-19 DIAGNOSIS — K219 Gastro-esophageal reflux disease without esophagitis: Secondary | ICD-10-CM | POA: Insufficient documentation

## 2020-07-19 DIAGNOSIS — E538 Deficiency of other specified B group vitamins: Secondary | ICD-10-CM

## 2020-07-19 DIAGNOSIS — D649 Anemia, unspecified: Secondary | ICD-10-CM

## 2020-07-19 DIAGNOSIS — I10 Essential (primary) hypertension: Secondary | ICD-10-CM | POA: Diagnosis not present

## 2020-07-19 NOTE — Progress Notes (Signed)
Pt here to get results of bone marrow bx. No concerns from pt standpoint

## 2020-07-19 NOTE — Progress Notes (Signed)
Hematology/Oncology Consult note [facility]  [phone] [phone]  Patient Care Team: Lavera Guise, MD as PCP - General (Internal Medicine) Bary Castilla Forest Gleason, MD (General Surgery) Sindy Guadeloupe, MD as Consulting Physician (Oncology)   Name of the patient: Laurie Gross  [id number]  1956-07-27   Date of visit: 07/19/20  Diagnosis-normocytic anemia of unclear etiology  Chief complaint/ Reason for visit-routine follow-up of anemia  Heme/Onc history: patient is a 64 year old African-American female with a past medical history significant for hypertension, hyperlipidemia and GERD among other medical problems. She has been referred to [place] for evaluation and management of anemia as well as elevated ferritin.  On review of outside labs patient has had chronic anemia and her hemoglobin has been between 9-10 since 2015. And her MCV ranges between 74-80. Hemoglobin electrophoresis in the past has been normal. Most recent CBC from 11/06/2017 showed white count of 4.2, H&H of 8.9/28.2 with an MCV of 84 and a platelet count of 314. TSH was within normal limits. CMP was within normal limits. Recent iron studies from 11/06/2017 showed elevated ferritin of 610. Iron saturation, serum iron and TIBC were normal. Of note patient has had an elevated ferritin since July 2017 that has been gradually increasing from 335 11/04/1966 to 662 in December 2018. Transferrin saturation has always been less than 35%. LFTs have always been normal.  Results of blood work from 12/09/2017 were as follows: CBC showed white count of 5.5, H&H of 10.1/30.7 with an MCV of 82.1 and a platelet count of 356. Differential was normal. CMP was essentially normal except for a mildly low potassium of 3.3 and elevated blood sugar of 150. LFTs and creatinine was normal serum calcium was normal. Ferritin levels were mildly elevated at 443 lowercompared to a prior value of 610. Iron  studies were within normal limits. Folate was mildly low at 5.3. B12 levels were low at 192. ESR was normal and ANA comprehensive panel was negative. Multiple myeloma panel revealed no monoclonal protein. Polyclonal gammopathy was noted. Serum free light chains revealed mildly elevated kappa of 24 with a normal kappa lambda ratio 1.2. Reticulocyte count was low normal at 1.8.   Interval history-no new complaints since her last visit.  Overall she is doing well  ECOG PS- 1 Pain scale- 0   Review of systems- Review of Systems  Constitutional: Negative for chills, fever, malaise/fatigue and weight loss.  HENT: Negative for congestion, ear discharge and nosebleeds.   Eyes: Negative for blurred vision.  Respiratory: Negative for cough, hemoptysis, sputum production, shortness of breath and wheezing.   Cardiovascular: Negative for chest pain, palpitations, orthopnea and claudication.  Gastrointestinal: Negative for abdominal pain, blood in stool, constipation, diarrhea, heartburn, melena, nausea and vomiting.  Genitourinary: Negative for dysuria, flank pain, frequency, hematuria and urgency.  Musculoskeletal: Negative for back pain, joint pain and myalgias.  Skin: Negative for rash.  Neurological: Negative for dizziness, tingling, focal weakness, seizures, weakness and headaches.  Endo/Heme/Allergies: Does not bruise/bleed easily.  Psychiatric/Behavioral: Negative for depression and suicidal ideas. The patient does not have insomnia.        Allergies  Allergen Reactions  . Hydrochlorothiazide Other (See Comments)    History of pancreatitis  . Amoxicillin Nausea Only  . Meloxicam Other (See Comments)  . Penicillins Nausea Only  . Simvastatin Nausea Only  . Sulfamethoxazole-Trimethoprim Nausea Only  . Tramadol Nausea Only     Past Medical History:  Diagnosis Date  . Anemia   .  Breast screening, unspecified   . Eczema   . Family history of colonic polyps   . Lump or mass  in breast   . Neoplasm of uncertain behavior of breast   . Personal history of colonic polyps   . Special screening for malignant neoplasms, colon   . Unspecified essential hypertension      Past Surgical History:  Procedure Laterality Date  . BREAST BIOPSY Right Age 21  . BREAST BIOPSY Left 07-21-12   Core biopsy showed atypical lobular hyperplasia and a 2 mm foci, atypical ductal hyperplasia and a 1 mm foci. There was no evidence of in situ or invasive cancer  . BREAST CYST ASPIRATION  August 2013   inconclusive, with atypical ductal cells identified  . COLONOSCOPY  2002, 2005, 2009, 2012   polyps   . TOTAL ABDOMINAL HYSTERECTOMY W/ BILATERAL SALPINGOOPHORECTOMY  Age 58    Social History   Socioeconomic History  . Marital status: Married    Spouse name: Not on file  . Number of children: Not on file  . Years of education: Not on file  . Highest education level: Not on file  Occupational History  . Not on file  Tobacco Use  . Smoking status: Never Smoker  . Smokeless tobacco: Never Used  Vaping Use  . Vaping Use: Never used  Substance and Sexual Activity  . Alcohol use: Not Currently  . Drug use: No  . Sexual activity: Not on file  Other Topics Concern  . Not on file  Social History Narrative  . Not on file   Social Determinants of Health   Financial Resource Strain:   . Difficulty of Paying Living Expenses: Not on file  Food Insecurity:   . Worried About Charity fundraiser in the Last Year: Not on file  . Ran Out of Food in the Last Year: Not on file  Transportation Needs:   . Lack of Transportation (Medical): Not on file  . Lack of Transportation (Non-Medical): Not on file  Physical Activity:   . Days of Exercise per Week: Not on file  . Minutes of Exercise per Session: Not on file  Stress:   . Feeling of Stress : Not on file  Social Connections:   . Frequency of Communication with Friends and Family: Not on file  . Frequency of Social Gatherings with  Friends and Family: Not on file  . Attends Religious Services: Not on file  . Active Member of Clubs or Organizations: Not on file  . Attends Archivist Meetings: Not on file  . Marital Status: Not on file  Intimate Partner Violence:   . Fear of Current or Ex-Partner: Not on file  . Emotionally Abused: Not on file  . Physically Abused: Not on file  . Sexually Abused: Not on file    Family History  Problem Relation Age of Onset  . Colon cancer Father   . Colon cancer Other        Paternal first cousin  . Hypertension Mother   . Anemia Mother      Current Outpatient Medications:  .  amLODipine (NORVASC) 10 MG tablet, TAKE 1 TABLET(10 MG) BY MOUTH DAILY, Disp: 30 tablet, Rfl: 2 .  Cholecalciferol (VITAMIN D-3) 5000 units TABS, Take 5,000 Units by mouth., Disp: , Rfl:  .  Docusate Sodium (STOOL SOFTENER LAXATIVE PO), Take by mouth as needed. , Disp: , Rfl:  .  folic acid (FOLVITE) 1 MG tablet, TAKE 1 TABLET BY  MOUTH EVERY DAY, Disp: 90 tablet, Rfl: 1 .  HYDROcodone-acetaminophen (NORCO/VICODIN) 5-325 MG tablet, 1-2 tabs po qd prn, Disp: 6 tablet, Rfl: 0 .  lisinopril (ZESTRIL) 40 MG tablet, TAKE 1 TABLET(40 MG) BY MOUTH DAILY, Disp: 30 tablet, Rfl: 3 .  Multiple Vitamins-Iron (MULTIVITAMIN/IRON PO), Take 1 capsule by mouth daily., Disp: , Rfl:  .  niacin (NIASPAN) 500 MG CR tablet, TAKE 1 TABLET(500 MG) BY MOUTH AT BEDTIME, Disp: 30 tablet, Rfl: 3 .  pantoprazole (PROTONIX) 40 MG tablet, Take 1 tablet (40 mg total) by mouth daily., Disp: 30 tablet, Rfl: 12 .  Saccharomyces boulardii (FLORASTOR PO), Take 1 capsule by mouth 3 (three) times a week., Disp: , Rfl:  .  triamcinolone cream (KENALOG) 0.1 %, Apply 1 application topically 2 (two) times daily. (Patient taking differently: Apply 1 application topically as needed. ), Disp: 30 g, Rfl: 0 .  vitamin B-12 (CYANOCOBALAMIN) 1000 MCG tablet, Take 1,000 mcg by mouth daily., Disp: , Rfl:   Physical exam:  Vitals:   07/19/20  0931  BP: (!) 142/76  Pulse: (!) 119  Resp: 16  Temp: 98.9 F (37.2 C)  TempSrc: Oral  Weight: 163 lb (73.9 kg)  Height: _0  (1.702 m)   Physical Exam Constitutional:      General: She is not in acute distress. Cardiovascular:     Rate and Rhythm: Regular rhythm. Tachycardia present.     Heart sounds: Normal heart sounds.  Pulmonary:     Effort: Pulmonary effort is normal.     Breath sounds: Normal breath sounds.  Musculoskeletal:     Cervical back: Normal range of motion.  Skin:    General: Skin is warm and dry.  Neurological:     Mental Status: She is alert and oriented to person, place, and time.      CMP Latest Ref Rng & Units 08/17/2019  Glucose 65 - 99 mg/dL 88  BUN 8 - 27 mg/dL 8  Creatinine 0.57 - 1.00 mg/dL 0.75  Sodium 134 - 144 mmol/L 142  Potassium 3.5 - 5.2 mmol/L 3.6  Chloride 96 - 106 mmol/L 100  CO2 20 - 29 mmol/L 22  Calcium 8.7 - 10.3 mg/dL 8.8  Total Protein 6.0 - 8.5 g/dL 7.5  Total Bilirubin 0.0 - 1.2 mg/dL 0.7  Alkaline Phos 39 - 117 IU/L 64  AST 0 - 40 IU/L 63(H)  ALT 0 - 32 IU/L 22   CBC Latest Ref Rng & Units 07/12/2020  WBC 4.0 - 10.5 K/uL 4.3  Hemoglobin 12.0 - 15.0 g/dL 9.6(L)  Hematocrit 36 - 46 % 29.7(L)  Platelets 150 - 400 K/uL 272    No images are attached to the encounter.  CT BONE MARROW BIOPSY & ASPIRATION  Result Date: 07/12/2020 INDICATION: Anemia of uncertain etiology. Please perform CT-guided bone marrow biopsy for tissue diagnostic purposes. EXAM: CT-GUIDED BONE MARROW BIOPSY AND ASPIRATION MEDICATIONS: None ANESTHESIA/SEDATION: Fentanyl 100 mcg IV; Versed 2 mg IV Sedation Time: 10 Minutes; The patient was continuously monitored during the procedure by the interventional radiology nurse under my direct supervision. COMPLICATIONS: None immediate. PROCEDURE: Informed consent was obtained from the patient following an explanation of the procedure, risks, benefits and alternatives. The patient understands, agrees and  consents for the procedure. All questions were addressed. A time out was performed prior to the initiation of the procedure. The patient was positioned prone and non-contrast localization CT was performed of the pelvis to demonstrate the iliac marrow spaces. The operative site was  prepped and draped in the usual sterile fashion. Under sterile conditions and local anesthesia, a 22 gauge spinal needle was utilized for procedural planning. Next, an 11 gauge coaxial bone biopsy needle was advanced into the left iliac marrow space. Needle position was confirmed with CT imaging. Initially, a bone marrow aspiration was performed. Next, a bone marrow biopsy was obtained with the 11 gauge outer bone marrow device. The 11 gauge coaxial bone biopsy needle was re-advanced into a slightly different location within the left iliac marrow space, positioning was confirmed with CT imaging and an additional bone marrow biopsy was obtained. The needle was removed and superficial hemostasis was obtained with manual compression. A dressing was applied. The patient tolerated the procedure well without immediate post procedural complication. IMPRESSION: Successful CT guided left iliac bone marrow aspiration and core biopsy. Electronically Signed   By: Sandi Mariscal M.D.   On: 07/12/2020 10:04     Assessment and plan- Patient is a 64 y.o. female with normocytic anemia of unclear etiology here to discuss bone marrow biopsy results  Bone marrow biopsy showed a normocellular marrow with trilineage hematopoiesis and no evidence of dyspoiesis or increased blast.  Serum iron was adequate in the bone marrow.  She did not have any primary bone marrow etiology to explain her anemia.I would therefore continue to monitor her anemia conservatively and see her back in 6 months with CBC ferritin iron studies B12 and folate  Also her peripheral blood work-up has been otherwise unremarkable.Patient was reassured after hearing her results.   Visit  Diagnosis 1. B12 deficiency   2. Folate deficiency   3. Normocytic anemia      Dr. Randa Evens, MD, MPH [facility] at [facility] [phone] 07/19/2020 4:00 PM

## 2020-07-20 ENCOUNTER — Encounter (HOSPITAL_COMMUNITY): Payer: Self-pay | Admitting: Oncology

## 2020-07-26 ENCOUNTER — Encounter (HOSPITAL_COMMUNITY): Payer: Self-pay | Admitting: Oncology

## 2020-07-28 LAB — SURGICAL PATHOLOGY

## 2020-08-04 DIAGNOSIS — H5203 Hypermetropia, bilateral: Secondary | ICD-10-CM | POA: Diagnosis not present

## 2020-08-08 ENCOUNTER — Other Ambulatory Visit: Payer: Self-pay | Admitting: Internal Medicine

## 2020-08-08 ENCOUNTER — Other Ambulatory Visit: Payer: Self-pay

## 2020-08-08 DIAGNOSIS — Z0189 Encounter for other specified special examinations: Secondary | ICD-10-CM

## 2020-08-08 DIAGNOSIS — E782 Mixed hyperlipidemia: Secondary | ICD-10-CM

## 2020-08-08 DIAGNOSIS — I10 Essential (primary) hypertension: Secondary | ICD-10-CM

## 2020-08-08 MED ORDER — NIACIN ER (ANTIHYPERLIPIDEMIC) 500 MG PO TBCR: EXTENDED_RELEASE_TABLET | ORAL | 3 refills | Status: DC

## 2020-08-08 NOTE — Progress Notes (Signed)
lipid

## 2020-08-11 DIAGNOSIS — I10 Essential (primary) hypertension: Secondary | ICD-10-CM | POA: Diagnosis not present

## 2020-08-11 DIAGNOSIS — Z0189 Encounter for other specified special examinations: Secondary | ICD-10-CM | POA: Diagnosis not present

## 2020-08-11 DIAGNOSIS — E782 Mixed hyperlipidemia: Secondary | ICD-10-CM | POA: Diagnosis not present

## 2020-08-12 LAB — COMPREHENSIVE METABOLIC PANEL
ALT: 14 IU/L (ref 0–32)
AST: 25 IU/L (ref 0–40)
Albumin/Globulin Ratio: 1.6 (ref 1.2–2.2)
Albumin: 4.5 g/dL (ref 3.8–4.8)
Alkaline Phosphatase: 50 IU/L (ref 44–121)
BUN/Creatinine Ratio: 17 (ref 12–28)
BUN: 11 mg/dL (ref 8–27)
Bilirubin Total: 0.5 mg/dL (ref 0.0–1.2)
CO2: 22 mmol/L (ref 20–29)
Calcium: 9.7 mg/dL (ref 8.7–10.3)
Chloride: 103 mmol/L (ref 96–106)
Creatinine, Ser: 0.63 mg/dL (ref 0.57–1.00)
GFR calc Af Amer: 110 mL/min/{1.73_m2} (ref >59)
GFR calc non Af Amer: 96 mL/min/{1.73_m2} (ref >59)
Globulin, Total: 2.8 g/dL (ref 1.5–4.5)
Glucose: 84 mg/dL (ref 65–99)
Potassium: 3.9 mmol/L (ref 3.5–5.2)
Sodium: 143 mmol/L (ref 134–144)
Total Protein: 7.3 g/dL (ref 6.0–8.5)

## 2020-08-12 LAB — TSH+FREE T4
Free T4: 1.28 ng/dL (ref 0.82–1.77)
TSH: 0.909 u[IU]/mL (ref 0.450–4.500)

## 2020-08-12 LAB — LIPID PANEL WITH LDL/HDL RATIO
Cholesterol, Total: 262 mg/dL — ABNORMAL HIGH (ref 100–199)
HDL: 71 mg/dL (ref >39)
LDL Chol Calc (NIH): 128 mg/dL — ABNORMAL HIGH (ref 0–99)
LDL/HDL Ratio: 1.8 ratio (ref 0.0–3.2)
Triglycerides: 357 mg/dL — ABNORMAL HIGH (ref 0–149)
VLDL Cholesterol Cal: 63 mg/dL — ABNORMAL HIGH (ref 5–40)

## 2020-08-12 NOTE — Progress Notes (Signed)
Abnormal lipid profile, will be discussed on next visit

## 2020-08-15 ENCOUNTER — Other Ambulatory Visit: Payer: Self-pay | Admitting: Internal Medicine

## 2020-08-15 DIAGNOSIS — I10 Essential (primary) hypertension: Secondary | ICD-10-CM

## 2020-08-30 ENCOUNTER — Encounter: Payer: Self-pay | Admitting: Internal Medicine

## 2020-08-30 ENCOUNTER — Other Ambulatory Visit: Payer: Self-pay

## 2020-08-30 ENCOUNTER — Ambulatory Visit (INDEPENDENT_AMBULATORY_CARE_PROVIDER_SITE_OTHER): Payer: BC Managed Care – PPO | Admitting: Internal Medicine

## 2020-08-30 DIAGNOSIS — E782 Mixed hyperlipidemia: Secondary | ICD-10-CM | POA: Diagnosis not present

## 2020-08-30 DIAGNOSIS — D509 Iron deficiency anemia, unspecified: Secondary | ICD-10-CM

## 2020-08-30 DIAGNOSIS — I10 Essential (primary) hypertension: Secondary | ICD-10-CM

## 2020-08-30 DIAGNOSIS — K219 Gastro-esophageal reflux disease without esophagitis: Secondary | ICD-10-CM

## 2020-08-30 DIAGNOSIS — E2839 Other primary ovarian failure: Secondary | ICD-10-CM

## 2020-08-30 MED ORDER — ROSUVASTATIN CALCIUM 5 MG PO TABS: 5.0000 mg | ORAL_TABLET | Freq: Every day | ORAL | 3 refills | Status: DC

## 2020-08-30 NOTE — Patient Instructions (Signed)

## 2020-08-30 NOTE — Progress Notes (Signed)
[facility] [place], [place] [address]  Internal MEDICINE  Office Visit Note  Patient Name: Laurie Gross  [id number]  [id number]  Date of Service: 08/30/2020  Chief Complaint  Patient presents with  . Follow-up    results of labs  . Hypertension  . Anemia  . policy update form    received  . Quality Metric Gaps    mammogram    HPI Pt is here for follow up - Abnormal lipid profile, worsening, she is on niacin, reluctant for statin therapy,  - Chronic anemia followed ny hematology, recent bone marrow biopsy negative for any pathology.  - HTN under good control  - Colonoscopy and EGD normal  - Mammogram 01/18/2020 Normal ( H/o atypical ductal Hyperplasia in 2012)  - BMD will be ordered on next visit 2022 She feels well with no acute symptoms, has been vaccinated with booster of COVID-19 Current Medication: Outpatient Encounter Medications as of 08/30/2020  Medication Sig  . amLODipine (NORVASC) 10 MG tablet TAKE 1 TABLET(10 MG) BY MOUTH DAILY  . Cholecalciferol (VITAMIN D-3) 5000 units TABS Take 5,000 Units by mouth.  Mariane Baumgarten Sodium (STOOL SOFTENER LAXATIVE PO) Take by mouth as needed.   . folic acid (FOLVITE) 1 MG tablet TAKE 1 TABLET BY MOUTH EVERY DAY  . HYDROcodone-acetaminophen (NORCO/VICODIN) 5-325 MG tablet 1-2 tabs po qd prn  . lisinopril (ZESTRIL) 40 MG tablet TAKE 1 TABLET(40 MG) BY MOUTH DAILY  . Multiple Vitamins-Iron (MULTIVITAMIN/IRON PO) Take 1 capsule by mouth daily.  . niacin (NIASPAN) 500 MG CR tablet TAKE 1 TABLET(500 MG) BY MOUTH AT BEDTIME  . pantoprazole (PROTONIX) 40 MG tablet Take 1 tablet (40 mg total) by mouth daily.  . rosuvastatin (CRESTOR) 5 MG tablet Take 1 tablet (5 mg total) by mouth daily.  . Saccharomyces boulardii (FLORASTOR PO) Take 1 capsule by mouth 3 (three) times a week.  . triamcinolone cream (KENALOG) 0.1 % Apply 1 application topically 2 (two) times daily. (Patient taking differently: Apply 1  application topically as needed. )  . vitamin B-12 (CYANOCOBALAMIN) 1000 MCG tablet Take 1,000 mcg by mouth daily.   No facility-administered encounter medications on file as of 08/30/2020.    Surgical History: Past Surgical History:  Procedure Laterality Date  . BREAST BIOPSY Right Age 56  . BREAST BIOPSY Left 07-21-12   Core biopsy showed atypical lobular hyperplasia and a 2 mm foci, atypical ductal hyperplasia and a 1 mm foci. There was no evidence of in situ or invasive cancer  . BREAST CYST ASPIRATION  August 2013   inconclusive, with atypical ductal cells identified  . COLONOSCOPY  2002, 2005, 2009, 2012   polyps   . TOTAL ABDOMINAL HYSTERECTOMY W/ BILATERAL SALPINGOOPHORECTOMY  Age 25    Medical History: Past Medical History:  Diagnosis Date  . Anemia   . Breast screening, unspecified   . Eczema   . Family history of colonic polyps   . Lump or mass in breast   . Neoplasm of uncertain behavior of breast   . Personal history of colonic polyps   . Special screening for malignant neoplasms, colon   . Unspecified essential hypertension     Family History: Family History  Problem Relation Age of Onset  . Colon cancer Father   . Colon cancer Other        Paternal first cousin  . Hypertension Mother   . Anemia Mother     Social History   Socioeconomic History  .  Marital status: Married    Spouse name: Not on file  . Number of children: Not on file  . Years of education: Not on file  . Highest education level: Not on file  Occupational History  . Not on file  Tobacco Use  . Smoking status: Never Smoker  . Smokeless tobacco: Never Used  Vaping Use  . Vaping Use: Never used  Substance and Sexual Activity  . Alcohol use: Not Currently  . Drug use: No  . Sexual activity: Not on file  Other Topics Concern  . Not on file  Social History Narrative  . Not on file   Social Determinants of Health   Financial Resource Strain:   . Difficulty of Paying Living  Expenses: Not on file  Food Insecurity:   . Worried About Charity fundraiser in the Last Year: Not on file  . Ran Out of Food in the Last Year: Not on file  Transportation Needs:   . Lack of Transportation (Medical): Not on file  . Lack of Transportation (Non-Medical): Not on file  Physical Activity:   . Days of Exercise per Week: Not on file  . Minutes of Exercise per Session: Not on file  Stress:   . Feeling of Stress : Not on file  Social Connections:   . Frequency of Communication with Friends and Family: Not on file  . Frequency of Social Gatherings with Friends and Family: Not on file  . Attends Religious Services: Not on file  . Active Member of Clubs or Organizations: Not on file  . Attends Archivist Meetings: Not on file  . Marital Status: Not on file  Intimate Partner Violence:   . Fear of Current or Ex-Partner: Not on file  . Emotionally Abused: Not on file  . Physically Abused: Not on file  . Sexually Abused: Not on file      Review of Systems  Constitutional: Negative for chills, diaphoresis and fatigue.  HENT: Negative for ear pain, postnasal drip and sinus pressure.   Eyes: Negative for photophobia, discharge, redness, itching and visual disturbance.  Respiratory: Negative for cough, shortness of breath and wheezing.   Cardiovascular: Negative for chest pain, palpitations and leg swelling.  Gastrointestinal: Negative for abdominal pain, constipation, diarrhea, nausea and vomiting.  Genitourinary: Negative for dysuria and flank pain.  Musculoskeletal: Negative for arthralgias, back pain, gait problem and neck pain.  Skin: Negative for color change.  Allergic/Immunologic: Negative for environmental allergies and food allergies.  Neurological: Negative for dizziness and headaches.  Hematological: Does not bruise/bleed easily.  Psychiatric/Behavioral: Negative for agitation, behavioral problems (depression) and hallucinations.    Vital Signs: BP (!)  142/62 Comment: 145/67  Pulse (!) 112   Temp 97.8 F (36.6 C)   Resp 16   Ht _0  (1.702 m)   Wt 156 lb 6.4 oz (70.9 kg)   SpO2 98%   BMI 24.50 kg/m    Physical Exam Constitutional:      General: She is not in acute distress.    Appearance: She is well-developed. She is not diaphoretic.  HENT:     Head: Normocephalic and atraumatic.     Mouth/Throat:     Pharynx: No oropharyngeal exudate.  Eyes:     Pupils: Pupils are equal, round, and reactive to light.  Neck:     Thyroid: No thyromegaly.     Vascular: No JVD.     Trachea: No tracheal deviation.  Cardiovascular:     Rate  and Rhythm: Normal rate and regular rhythm.     Heart sounds: Normal heart sounds. No murmur heard.  No friction rub. No gallop.   Pulmonary:     Effort: Pulmonary effort is normal. No respiratory distress.     Breath sounds: No wheezing or rales.  Chest:     Chest wall: No tenderness.  Abdominal:     General: Bowel sounds are normal.     Palpations: Abdomen is soft.  Musculoskeletal:        General: Normal range of motion.     Cervical back: Normal range of motion and neck supple.  Lymphadenopathy:     Cervical: No cervical adenopathy.  Skin:    General: Skin is warm and dry.  Neurological:     Mental Status: She is alert and oriented to person, place, and time.     Cranial Nerves: No cranial nerve deficit.  Psychiatric:        Behavior: Behavior normal.        Thought Content: Thought content normal.        Judgment: Judgment normal.         Assessment/Plan: 1. Mixed hyperlipidemia Continue Niacin as before, start low dose Crestor for abnormal lipid profile, repeat fasting levels in 4 months - Lipid Panel With LDL/HDL Ratio  2. Essential hypertension, benign Controlled, continue Norvasc and Zestril   3. Gastroesophageal reflux disease without esophagitis Controlled with Protonix   4. Microcytic anemia Managed by Hematology   5. Other primary ovarian failure Will order BMD  on next visit   General Counseling: Layali verbalizes understanding of the findings of todays visit and agrees with plan of treatment. I have discussed any further diagnostic evaluation that may be needed or ordered today. We also reviewed her medications today. she has been encouraged to call the office with any questions or concerns that should arise related to todays visit.    Orders Placed This Encounter  Procedures  . Lipid Panel With LDL/HDL Ratio    Meds ordered this encounter  Medications  . rosuvastatin (CRESTOR) 5 MG tablet    Sig: Take 1 tablet (5 mg total) by mouth daily.    Dispense:  90 tablet    Refill:  3    Total time spent:30 Minutes Time spent includes review of chart, medications, test results, and follow up plan with the patient.      Dr Lavera Guise Internal medicine

## 2020-10-18 ENCOUNTER — Telehealth: Payer: Self-pay | Admitting: Oncology

## 2020-10-18 NOTE — Telephone Encounter (Signed)
Pt called and requested to cancel appt on 1/19 due to "personal issues"--stated she will have to call back to r/s--- JCS

## 2020-10-19 ENCOUNTER — Inpatient Hospital Stay: Payer: BC Managed Care – PPO

## 2020-10-25 ENCOUNTER — Other Ambulatory Visit: Payer: Self-pay | Admitting: Oncology

## 2020-12-06 ENCOUNTER — Other Ambulatory Visit: Payer: Self-pay | Admitting: Internal Medicine

## 2020-12-06 DIAGNOSIS — I10 Essential (primary) hypertension: Secondary | ICD-10-CM

## 2020-12-13 ENCOUNTER — Other Ambulatory Visit: Payer: Self-pay | Admitting: Internal Medicine

## 2020-12-13 DIAGNOSIS — E782 Mixed hyperlipidemia: Secondary | ICD-10-CM

## 2020-12-27 ENCOUNTER — Ambulatory Visit: Payer: BC Managed Care – PPO | Admitting: Internal Medicine

## 2020-12-31 ENCOUNTER — Encounter: Payer: Self-pay | Admitting: Emergency Medicine

## 2020-12-31 ENCOUNTER — Other Ambulatory Visit: Payer: Self-pay

## 2020-12-31 ENCOUNTER — Ambulatory Visit
Admission: EM | Admit: 2020-12-31 | Discharge: 2020-12-31 | Disposition: A | Discharge status: Home / Self Care | Payer: BC Managed Care – PPO | Attending: Sports Medicine | Admitting: Sports Medicine

## 2020-12-31 DIAGNOSIS — Z01812 Encounter for preprocedural laboratory examination: Secondary | ICD-10-CM | POA: Insufficient documentation

## 2020-12-31 DIAGNOSIS — R0981 Nasal congestion: Secondary | ICD-10-CM

## 2020-12-31 DIAGNOSIS — R059 Cough, unspecified: Secondary | ICD-10-CM

## 2020-12-31 DIAGNOSIS — J029 Acute pharyngitis, unspecified: Secondary | ICD-10-CM | POA: Insufficient documentation

## 2020-12-31 DIAGNOSIS — J069 Acute upper respiratory infection, unspecified: Secondary | ICD-10-CM

## 2020-12-31 DIAGNOSIS — R5383 Other fatigue: Secondary | ICD-10-CM

## 2020-12-31 DIAGNOSIS — J3489 Other specified disorders of nose and nasal sinuses: Secondary | ICD-10-CM | POA: Insufficient documentation

## 2020-12-31 DIAGNOSIS — Z20822 Contact with and (suspected) exposure to covid-19: Secondary | ICD-10-CM | POA: Insufficient documentation

## 2020-12-31 LAB — GROUP A STREP BY PCR: Group A Strep by PCR: NOT DETECTED

## 2020-12-31 NOTE — ED Provider Notes (Signed)
[facility]    CSN: [id number] Arrival date & time: 12/31/20  0835      History   Chief Complaint Chief Complaint  Patient presents with  . Cough  . Nasal Congestion    HPI Laurie Gross is a 65 y.o. female.   Patient is a pleasant 65 year old female who presents for evaluation of sore throat, rhinorrhea, fatigue, nasal congestion, and a mild cough.  Her symptoms began yesterday.  They worsened today and she comes in today for the initial evaluation in the urgent care.  Normally sees Dr. Humphrey Rolls for ongoing medical care but she is not available today.  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  The kids are sick all the time she says but she has no known Covid exposure and no known flu exposure and no known strep exposure.  She has been vaccinated against COVID and has received her booster.  She is also received her flu shot.  She denies any chest pain, shortness of breath, abdominal symptoms, or urinary symptoms.  No red flag signs or symptoms elicited on history.     Past Medical History:  Diagnosis Date  . Anemia   . Breast screening, unspecified   . Eczema   . Family history of colonic polyps   . Lump or mass in breast   . Neoplasm of uncertain behavior of breast   . Personal history of colonic polyps   . Special screening for malignant neoplasms, colon   . Unspecified essential hypertension     Patient Active Problem List   Diagnosis Date Noted  . Family history of colon cancer 04/08/2017  . Hx of colonic polyps 04/08/2017  . Cervical radiculopathy 12/15/2014  . Anemia 02/14/2014  . Vitamin D deficiency, unspecified 02/14/2014  . GERD (gastroesophageal reflux disease) 02/12/2014  . Hypertension 02/12/2014  . Pseudocyst of pancreas 10/01/2013    Past Surgical History:  Procedure Laterality Date  . BREAST BIOPSY Right Age 77  . BREAST BIOPSY Left 07-21-12   Core biopsy showed atypical lobular hyperplasia and a 2 mm foci, atypical ductal  hyperplasia and a 1 mm foci. There was no evidence of in situ or invasive cancer  . BREAST CYST ASPIRATION  August 2013   inconclusive, with atypical ductal cells identified  . COLONOSCOPY  2002, 2005, 2009, 2012   polyps   . TOTAL ABDOMINAL HYSTERECTOMY W/ BILATERAL SALPINGOOPHORECTOMY  Age 31    OB History   No obstetric history on file.    Obstetric Comments  Age first menstrual cycle 64 Age last menstrual cycle 35- due to hysterectomy         Home Medications    Prior to Admission medications   Medication Sig Start Date End Date Taking? Authorizing Provider  amLODipine (NORVASC) 10 MG tablet TAKE 1 TABLET(10 MG) BY MOUTH DAILY 08/15/20  Yes Lavera Guise, MD  Cholecalciferol (VITAMIN D-3) 5000 units TABS Take 5,000 Units by mouth.   Yes [provider]  Docusate Sodium (STOOL SOFTENER LAXATIVE PO) Take by mouth as needed.    Yes [provider]  folic acid (FOLVITE) 1 MG tablet TAKE 1 TABLET BY MOUTH EVERY DAY 10/25/20  Yes Sindy Guadeloupe, MD  HYDROcodone-acetaminophen (NORCO/VICODIN) 5-325 MG tablet 1-2 tabs po qd prn 04/13/20  Yes Conty, [place], MD  lisinopril (ZESTRIL) 40 MG tablet TAKE 1 TABLET(40 MG) BY MOUTH DAILY 12/06/20  Yes Lavera Guise, MD  Multiple Vitamins-Iron (MULTIVITAMIN/IRON PO) Take 1 capsule by  mouth daily.   Yes [provider]  niacin (NIASPAN) 500 MG CR tablet TAKE 1 TABLET(500 MG) BY MOUTH AT BEDTIME 12/13/20  Yes Lavera Guise, MD  pantoprazole (PROTONIX) 40 MG tablet Take 1 tablet (40 mg total) by mouth daily. 11/06/17  Yes Lavera Guise, MD  rosuvastatin (CRESTOR) 5 MG tablet Take 1 tablet (5 mg total) by mouth daily. 08/30/20  Yes Lavera Guise, MD  Saccharomyces boulardii (FLORASTOR PO) Take 1 capsule by mouth 3 (three) times a week.   Yes [provider]  vitamin B-12 (CYANOCOBALAMIN) 1000 MCG tablet Take 1,000 mcg by mouth daily.   Yes [provider]  triamcinolone cream (KENALOG) 0.1 % Apply 1 application  topically 2 (two) times daily. Patient taking differently: Apply 1 application topically as needed.  08/19/18   Kendell Bane, NP    Family History Family History  Problem Relation Age of Onset  . Colon cancer Father   . Colon cancer Other        Paternal first cousin  . Hypertension Mother   . Anemia Mother     Social History Social History   Tobacco Use  . Smoking status: Never Smoker  . Smokeless tobacco: Never Used  Vaping Use  . Vaping Use: Never used  Substance Use Topics  . Alcohol use: Not Currently  . Drug use: No     Allergies   Hydrochlorothiazide, Amoxicillin, Meloxicam, Penicillins, Simvastatin, Sulfamethoxazole-trimethoprim, and Tramadol   Review of Systems Review of Systems  Constitutional: Negative for activity change, appetite change, chills, diaphoresis, fatigue and fever.  HENT: Positive for congestion and sore throat. Negative for ear discharge, ear pain, postnasal drip, sinus pressure, sinus pain, sneezing and trouble swallowing.   Eyes: Negative.  Negative for pain.  Respiratory: Positive for cough. Negative for chest tightness, shortness of breath, wheezing and stridor.   Cardiovascular: Negative.  Negative for chest pain and palpitations.  Gastrointestinal: Negative.  Negative for abdominal pain.  Genitourinary: Negative.  Negative for dysuria.  Musculoskeletal: Negative.  Negative for arthralgias, myalgias, neck pain and neck stiffness.  Skin: Negative.  Negative for color change, pallor, rash and wound.  Neurological: Negative.  Negative for dizziness, tremors, syncope, weakness, light-headedness, numbness and headaches.  All other systems reviewed and are negative.    Physical Exam Triage Vital Signs ED Triage Vitals  Enc Vitals Group     BP 12/31/20 0850 133/65     Pulse Rate 12/31/20 0850 85     Resp 12/31/20 0850 14     Temp 12/31/20 0850 98.3 F (36.8 C)     Temp Source 12/31/20 0850 Oral     SpO2 12/31/20 0850 100 %      Weight 12/31/20 0846 157 lb (71.2 kg)     Height 12/31/20 0846 5\' 7"  (1.702 m)     Head Circumference --      Peak Flow --      Pain Score 12/31/20 0846 7     Pain Loc --      Pain Edu? --      Excl. in [place]? --    No data found.  Updated Vital Signs BP 133/65 (BP Location: Right Arm)   Pulse 85   Temp 98.3 F (36.8 C) (Oral)   Resp 14   Ht 5\' 7"  (1.702 m)   Wt 71.2 kg   SpO2 100%   BMI 24.59 kg/m   Visual Acuity Right Eye Distance:   Left Eye Distance:  Bilateral Distance:    Right Eye Near:   Left Eye Near:    Bilateral Near:     Physical Exam Vitals and nursing note reviewed.  Constitutional:      General: She is not in acute distress.    Appearance: Normal appearance. She is not ill-appearing, toxic-appearing or diaphoretic.  HENT:     Head: Normocephalic and atraumatic.     Nose: Congestion and rhinorrhea present.     Mouth/Throat:     Mouth: Mucous membranes are moist.     Pharynx: Posterior oropharyngeal erythema present. No oropharyngeal exudate.  Eyes:     General: No scleral icterus.       Right eye: No discharge.        Left eye: No discharge.     Extraocular Movements: Extraocular movements intact.     Conjunctiva/sclera: Conjunctivae normal.     Pupils: Pupils are equal, round, and reactive to light.  Cardiovascular:     Rate and Rhythm: Normal rate and regular rhythm.     Pulses: Normal pulses.     Heart sounds: Normal heart sounds. No murmur heard. No gallop.   Pulmonary:     Effort: Pulmonary effort is normal. No respiratory distress.     Breath sounds: Normal breath sounds. No stridor. No wheezing, rhonchi or rales.  Abdominal:     General: There is no distension.     Tenderness: There is no abdominal tenderness. There is no right CVA tenderness, left CVA tenderness, guarding or rebound.  Musculoskeletal:     Cervical back: Normal range of motion and neck supple. No rigidity or tenderness.  Lymphadenopathy:     Cervical: Cervical  adenopathy present.  Skin:    General: Skin is warm and dry.     Capillary Refill: Capillary refill takes less than 2 seconds.     Coloration: Skin is not jaundiced or pale.     Findings: No bruising, erythema, lesion or rash.  Neurological:     General: No focal deficit present.     Mental Status: She is alert and oriented to person, place, and time.  Psychiatric:        Mood and Affect: Mood normal.      UC Treatments / Results  Labs (all labs ordered are listed, but only abnormal results are displayed) Labs Reviewed  GROUP A STREP BY PCR  SARS CORONAVIRUS 2 (TAT 6-24 HRS)    EKG   Radiology No results found.  Procedures Procedures (including critical care time)  Medications Ordered in UC Medications - No data to display  Initial Impression / Assessment and Plan / UC Course  I have reviewed the triage vital signs and the nursing notes.  Pertinent labs & imaging results that were available during my care of the patient were reviewed by me and considered in my medical decision making (see chart for details).   Clinical impression: 1.  Viral upper respiratory infection with nasal congestion, mild cough, runny nose, and fatigue. 2.  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  Treatment plan: 1.  The findings and treatment plan were discussed in detail with the patient.  Patient was in agreement. 2.  We will get a strep test and it was negative. 3.  We will get a Covid test that was pending at discharge.  Someone will contact her if it is a positive result.  She should follow along on MyChart to see her results. 4.  Recommended supportive care, over-the-counter meds as needed, plenty of rest, plenty of fluids, Tylenol or Motrin for any fever or discomfort. 5.  No antibiotics are indicated at this time. 6.  I went over the red flag signs and symptoms and when to seek out immediate medical care.  If she develops any of these then she  should call 911 and go to the emergency room.  She voiced verbal understanding. 7.  If symptoms persist then she should follow-up with her primary care provider. 8.  Follow-up here as needed.    Final Clinical Impressions(s) / UC Diagnoses   Final diagnoses:  Viral upper respiratory tract infection  Cough  Sore throat  Rhinorrhea  Nasal congestion  Fatigue, unspecified type     Discharge Instructions     You have a viral upper respiratory infection with nasal congestion, mild cough, runny nose, and fatigue.  You also have a sore throat that appears to be viral in nature.  Your strep test showed that you do not have strep throat.  Recommend supportive care, over-the-counter meds as needed, plenty of rest, plenty of fluids, Tylenol or Motrin for any fever or discomfort.  Your Covid test was pending at discharge.  Someone will contact you with the results if they are positive.  Until you know your Covid results I recommend that you appropriately social distancing use a mask even at home.  If you develop worsening symptoms, chest pain, shortness of breath then please call 911 or go to the ER.  I hope you get to feeling better, Dr. Drema Dallas    ED Prescriptions    None     PDMP not reviewed this encounter.   Verda Cumins, MD 12/31/20 [phone]

## 2020-12-31 NOTE — ED Triage Notes (Signed)
Patient c/o cough, sore throat, fatigue, and runny nose that started on Friday.  Patient denies fevers.

## 2020-12-31 NOTE — Discharge Instructions (Addendum)
You have a viral upper respiratory infection with nasal congestion, mild cough, runny nose, and fatigue.  You also have a sore throat that appears to be viral in nature.  Your strep test showed that you do not have strep throat.  Recommend supportive care, over-the-counter meds as needed, plenty of rest, plenty of fluids, Tylenol or Motrin for any fever or discomfort.  Your Covid test was pending at discharge.  Someone will contact you with the results if they are positive.  Until you know your Covid results I recommend that you appropriately social distancing use a mask even at home.  If you develop worsening symptoms, chest pain, shortness of breath then please call 911 or go to the ER.  I hope you get to feeling better, Dr. Drema Dallas

## 2021-01-01 LAB — SARS CORONAVIRUS 2 (TAT 6-24 HRS): SARS Coronavirus 2: NEGATIVE

## 2021-01-17 ENCOUNTER — Inpatient Hospital Stay (HOSPITAL_BASED_OUTPATIENT_CLINIC_OR_DEPARTMENT_OTHER): Payer: BC Managed Care – PPO | Admitting: Oncology

## 2021-01-17 ENCOUNTER — Other Ambulatory Visit: Payer: Self-pay

## 2021-01-17 ENCOUNTER — Inpatient Hospital Stay: Payer: BC Managed Care – PPO | Attending: Oncology

## 2021-01-17 ENCOUNTER — Encounter: Payer: Self-pay | Admitting: Oncology

## 2021-01-17 VITALS — BP 138/66 | HR 78 | Temp 98.4°F | Resp 20 | Wt 152.9 lb

## 2021-01-17 DIAGNOSIS — D649 Anemia, unspecified: Secondary | ICD-10-CM | POA: Diagnosis not present

## 2021-01-17 DIAGNOSIS — D890 Polyclonal hypergammaglobulinemia: Secondary | ICD-10-CM | POA: Diagnosis not present

## 2021-01-17 DIAGNOSIS — Z8639 Personal history of other endocrine, nutritional and metabolic disease: Secondary | ICD-10-CM | POA: Insufficient documentation

## 2021-01-17 DIAGNOSIS — R5383 Other fatigue: Secondary | ICD-10-CM | POA: Diagnosis not present

## 2021-01-17 DIAGNOSIS — Z800 Family history of malignant neoplasm of digestive organs: Secondary | ICD-10-CM | POA: Insufficient documentation

## 2021-01-17 DIAGNOSIS — K219 Gastro-esophageal reflux disease without esophagitis: Secondary | ICD-10-CM | POA: Insufficient documentation

## 2021-01-17 DIAGNOSIS — R5381 Other malaise: Secondary | ICD-10-CM | POA: Insufficient documentation

## 2021-01-17 DIAGNOSIS — I10 Essential (primary) hypertension: Secondary | ICD-10-CM | POA: Diagnosis not present

## 2021-01-17 DIAGNOSIS — E538 Deficiency of other specified B group vitamins: Secondary | ICD-10-CM

## 2021-01-17 LAB — CBC WITH DIFFERENTIAL/PLATELET
Abs Immature Granulocytes: 0.01 10*3/uL (ref 0.00–0.07)
Basophils Absolute: 0 10*3/uL (ref 0.0–0.1)
Basophils Relative: 1 %
Eosinophils Absolute: 0.1 10*3/uL (ref 0.0–0.5)
Eosinophils Relative: 2 %
HCT: 27.1 % — ABNORMAL LOW (ref 36.0–46.0)
Hemoglobin: 8.9 g/dL — ABNORMAL LOW (ref 12.0–15.0)
Immature Granulocytes: 0 %
Lymphocytes Relative: 44 %
Lymphs Abs: 2.1 10*3/uL (ref 0.7–4.0)
MCH: 26.6 pg (ref 26.0–34.0)
MCHC: 32.8 g/dL (ref 30.0–36.0)
MCV: 81.1 fL (ref 80.0–100.0)
Monocytes Absolute: 0.3 10*3/uL (ref 0.1–1.0)
Monocytes Relative: 7 %
Neutro Abs: 2.2 10*3/uL (ref 1.7–7.7)
Neutrophils Relative %: 46 %
Platelets: 240 10*3/uL (ref 150–400)
RBC: 3.34 MIL/uL — ABNORMAL LOW (ref 3.87–5.11)
RDW: 15.5 % (ref 11.5–15.5)
WBC: 4.7 10*3/uL (ref 4.0–10.5)
nRBC: 0.6 % — ABNORMAL HIGH (ref 0.0–0.2)

## 2021-01-17 LAB — IRON AND TIBC
Iron: 96 ug/dL (ref 28–170)
Saturation Ratios: 37 % — ABNORMAL HIGH (ref 10.4–31.8)
TIBC: 260 ug/dL (ref 250–450)
UIBC: 164 ug/dL

## 2021-01-17 LAB — FOLATE: Folate: 26 ng/mL (ref >5.9)

## 2021-01-17 LAB — VITAMIN B12: Vitamin B-12: 932 pg/mL — ABNORMAL HIGH (ref 180–914)

## 2021-01-17 LAB — FERRITIN: Ferritin: 430 ng/mL — ABNORMAL HIGH (ref 11–307)

## 2021-01-21 NOTE — Progress Notes (Signed)
Hematology/Oncology Consult note [facility]  [phone] [phone]  Patient Care Team: Lavera Guise, MD as PCP - General (Internal Medicine) Bary Castilla [place] Gleason, MD (General Surgery) Sindy Guadeloupe, MD as Consulting Physician (Oncology)   Name of the patient: Laurie Gross  [id number]  [birth year]   Date of visit: 01/21/21  Diagnosis-normocytic anemia of unclear etiology  Chief complaint/ Reason for visit-routine follow-up of anemia  Heme/Onc history: patient is a 65 year old African-American female with a past medical history significant for hypertension, hyperlipidemia and GERD among other medical problems. She has been referred to [place] for evaluation and management of anemia as well as elevated ferritin.  Results of blood work from 12/09/2017 were as follows: CBC showed white count of 5.5, H&H of 10.1/30.7 with an MCV of 82.1 and a platelet count of 356. Differential was normal. CMP was essentially normal except for a mildly low potassium of 3.3 and elevated blood sugar of 150. LFTs and creatinine was normal serum calcium was normal. Ferritin levels were mildly elevated at 443 lowercompared to a prior value of 610. Iron studies were within normal limits. Folate was mildly low at 5.3. B12 levels were low at 192. ESR was normal and ANA comprehensive panel was negative. Multiple myeloma panel revealed no monoclonal protein. Polyclonal gammopathy was noted. Serum free light chains revealed mildly elevated kappa of 24 with a normal kappa lambda ratio 1.2. Reticulocyte count was low normal at 1.8.  Patient had bone marrow biopsy in October 2021 which showed normocellular marrow with trilineage hematopoiesis.  Iron stores increased with only rare ring sideroblasts.  Polyclonal increase in plasma cells 7%.  Normal karyotype MDS FISH analysis negative.   Interval history-patient reports doing well overall.  She is lost about 9 pounds in  the last 6 months but states partly it has been intentional.  ECOG PS- 1 Pain scale- 0   Review of systems- Review of Systems  Constitutional: Positive for malaise/fatigue. Negative for chills, fever and weight loss.  HENT: Negative for congestion, ear discharge and nosebleeds.   Eyes: Negative for blurred vision.  Respiratory: Negative for cough, hemoptysis, sputum production, shortness of breath and wheezing.   Cardiovascular: Negative for chest pain, palpitations, orthopnea and claudication.  Gastrointestinal: Negative for abdominal pain, blood in stool, constipation, diarrhea, heartburn, melena, nausea and vomiting.  Genitourinary: Negative for dysuria, flank pain, frequency, hematuria and urgency.  Musculoskeletal: Negative for back pain, joint pain and myalgias.  Skin: Negative for rash.  Neurological: Negative for dizziness, tingling, focal weakness, seizures, weakness and headaches.  Endo/Heme/Allergies: Does not bruise/bleed easily.  Psychiatric/Behavioral: Negative for depression and suicidal ideas. The patient does not have insomnia.      Allergies  Allergen Reactions  . Hydrochlorothiazide Other (See Comments)    History of pancreatitis  . Amoxicillin Nausea Only  . Meloxicam Other (See Comments)  . Penicillins Nausea Only  . Simvastatin Nausea Only  . Sulfamethoxazole-Trimethoprim Nausea Only  . Tramadol Nausea Only     Past Medical History:  Diagnosis Date  . Anemia   . Breast screening, unspecified   . Eczema   . Family history of colonic polyps   . Lump or mass in breast   . Neoplasm of uncertain behavior of breast   . Personal history of colonic polyps   . Special screening for malignant neoplasms, colon   . Unspecified essential hypertension      Past Surgical History:  Procedure Laterality Date  . BREAST BIOPSY  Right Age 85  . BREAST BIOPSY Left 07-21-12   Core biopsy showed atypical lobular hyperplasia and a 2 mm foci, atypical ductal  hyperplasia and a 1 mm foci. There was no evidence of in situ or invasive cancer  . BREAST CYST ASPIRATION  August 2013   inconclusive, with atypical ductal cells identified  . COLONOSCOPY  2002, 2005, 2009, 2012   polyps   . TOTAL ABDOMINAL HYSTERECTOMY W/ BILATERAL SALPINGOOPHORECTOMY  Age 70    Social History   Socioeconomic History  . Marital status: Married    Spouse name: Not on file  . Number of children: Not on file  . Years of education: Not on file  . Highest education level: Not on file  Occupational History  . Not on file  Tobacco Use  . Smoking status: Never Smoker  . Smokeless tobacco: Never Used  Vaping Use  . Vaping Use: Never used  Substance and Sexual Activity  . Alcohol use: Not Currently  . Drug use: No  . Sexual activity: Not on file  Other Topics Concern  . Not on file  Social History Narrative  . Not on file   Social Determinants of Health   Financial Resource Strain: Not on file  Food Insecurity: Not on file  Transportation Needs: Not on file  Physical Activity: Not on file  Stress: Not on file  Social Connections: Not on file  Intimate Partner Violence: Not on file    Family History  Problem Relation Age of Onset  . Colon cancer Father   . Colon cancer Other        Paternal first cousin  . Hypertension Mother   . Anemia Mother      Current Outpatient Medications:  .  amLODipine (NORVASC) 10 MG tablet, TAKE 1 TABLET(10 MG) BY MOUTH DAILY, Disp: 30 tablet, Rfl: 2 .  Cholecalciferol (VITAMIN D-3) 5000 units TABS, Take 5,000 Units by mouth., Disp: , Rfl:  .  folic acid (FOLVITE) 1 MG tablet, TAKE 1 TABLET BY MOUTH EVERY DAY, Disp: 90 tablet, Rfl: 1 .  HYDROcodone-acetaminophen (NORCO/VICODIN) 5-325 MG tablet, 1-2 tabs po qd prn, Disp: 6 tablet, Rfl: 0 .  lisinopril (ZESTRIL) 40 MG tablet, TAKE 1 TABLET(40 MG) BY MOUTH DAILY, Disp: 30 tablet, Rfl: 3 .  Multiple Vitamins-Iron (MULTIVITAMIN/IRON PO), Take 1 capsule by mouth daily., Disp: ,  Rfl:  .  niacin (NIASPAN) 500 MG CR tablet, TAKE 1 TABLET(500 MG) BY MOUTH AT BEDTIME, Disp: 90 tablet, Rfl: 1 .  pantoprazole (PROTONIX) 40 MG tablet, Take 1 tablet (40 mg total) by mouth daily., Disp: 30 tablet, Rfl: 12 .  rosuvastatin (CRESTOR) 5 MG tablet, Take 1 tablet (5 mg total) by mouth daily., Disp: 90 tablet, Rfl: 3 .  vitamin B-12 (CYANOCOBALAMIN) 1000 MCG tablet, Take 1,000 mcg by mouth daily., Disp: , Rfl:  .  Docusate Sodium (STOOL SOFTENER LAXATIVE PO), Take by mouth as needed.  (Patient not taking: Reported on 01/17/2021), Disp: , Rfl:  .  Saccharomyces boulardii (FLORASTOR PO), Take 1 capsule by mouth 3 (three) times a week. (Patient not taking: Reported on 01/17/2021), Disp: , Rfl:  .  triamcinolone cream (KENALOG) 0.1 %, Apply 1 application topically 2 (two) times daily. (Patient not taking: Reported on 01/17/2021), Disp: 30 g, Rfl: 0  Physical exam:  Vitals:   01/17/21 1008  BP: 138/66  Pulse: 78  Resp: 20  Temp: 98.4 F (36.9 C)  TempSrc: Tympanic  SpO2: 100%  Weight: 152 lb 14.4  oz (69.4 kg)   Physical Exam Cardiovascular:     Rate and Rhythm: Normal rate and regular rhythm.     Heart sounds: Normal heart sounds.  Pulmonary:     Effort: Pulmonary effort is normal.     Breath sounds: Normal breath sounds.  Abdominal:     General: Bowel sounds are normal.     Palpations: Abdomen is soft.  Skin:    General: Skin is warm and dry.  Neurological:     Mental Status: She is alert and oriented to person, place, and time.      CMP Latest Ref Rng & Units 08/11/2020  Glucose 65 - 99 mg/dL 84  BUN 8 - 27 mg/dL 11  Creatinine 0.57 - 1.00 mg/dL 0.63  Sodium 134 - 144 mmol/L 143  Potassium 3.5 - 5.2 mmol/L 3.9  Chloride 96 - 106 mmol/L 103  CO2 20 - 29 mmol/L 22  Calcium 8.7 - 10.3 mg/dL 9.7  Total Protein 6.0 - 8.5 g/dL 7.3  Total Bilirubin 0.0 - 1.2 mg/dL 0.5  Alkaline Phos 44 - 121 IU/L 50  AST 0 - 40 IU/L 25  ALT 0 - 32 IU/L 14   CBC Latest Ref Rng & Units  01/17/2021  WBC 4.0 - 10.5 K/uL 4.7  Hemoglobin 12.0 - 15.0 g/dL 8.9(L)  Hematocrit 36.0 - 46.0 % 27.1(L)  Platelets 150 - 400 K/uL 240    Assessment and plan- Patient is a 65 y.o. female    With normocytic anemia here for routine follow-up  Patient's baseline hemoglobin typically runs between 9.5-10.5 at least since 2019.  She has never been higher.  We have done extensive work-up for anemia including a bone marrow biopsy which has not revealed a clear etiology.  White cell count and platelets have been normal.  Today her hemoglobin is 8.9 which is a little lower than before.  Ferritin levels have always been mildly high in the 400s and is presently 430.  B12 and folate are normal iron saturation is mildly elevated at 37% but never higher.  At this point the cause of her anemia is unclear.  I am inclined to monitor this conservatively since she has dropped to8.9 before and then came back to her baseline of between 9.5-10.5.  I will see her back in 3 months with CBC ferritin and iron studies B12 B1 B6, ESR and vasculitis panel Visit Diagnosis 1. Normocytic anemia      Dr. Randa Evens, MD, MPH [facility] at [facility] [phone] 01/21/2021 4:25 PM

## 2021-01-24 ENCOUNTER — Other Ambulatory Visit: Payer: Self-pay | Admitting: Internal Medicine

## 2021-01-24 DIAGNOSIS — I10 Essential (primary) hypertension: Secondary | ICD-10-CM

## 2021-03-02 DIAGNOSIS — Z1231 Encounter for screening mammogram for malignant neoplasm of breast: Secondary | ICD-10-CM | POA: Diagnosis not present

## 2021-03-14 ENCOUNTER — Other Ambulatory Visit: Payer: Self-pay

## 2021-03-14 ENCOUNTER — Ambulatory Visit (INDEPENDENT_AMBULATORY_CARE_PROVIDER_SITE_OTHER): Payer: BC Managed Care – PPO | Admitting: Nurse Practitioner

## 2021-03-14 ENCOUNTER — Encounter: Payer: Self-pay | Admitting: Nurse Practitioner

## 2021-03-14 VITALS — BP 128/68 | HR 88 | Temp 98.7°F | Resp 16 | Ht 67.0 in | Wt 149.4 lb

## 2021-03-14 DIAGNOSIS — Z0001 Encounter for general adult medical examination with abnormal findings: Secondary | ICD-10-CM

## 2021-03-14 DIAGNOSIS — K219 Gastro-esophageal reflux disease without esophagitis: Secondary | ICD-10-CM | POA: Diagnosis not present

## 2021-03-14 DIAGNOSIS — R300 Dysuria: Secondary | ICD-10-CM

## 2021-03-14 DIAGNOSIS — E782 Mixed hyperlipidemia: Secondary | ICD-10-CM | POA: Diagnosis not present

## 2021-03-14 DIAGNOSIS — I10 Essential (primary) hypertension: Secondary | ICD-10-CM

## 2021-03-14 NOTE — Progress Notes (Signed)
[facility] [place], [place] [address]  Internal MEDICINE  Office Visit Note  Patient Name: Laurie Gross  [id number]  [id number]  Date of Service: 03/21/2021  Chief Complaint  Patient presents with   Annual Exam   Hypertension   Quality Metric Gaps    Colonoscopy, mammogram     HPI [name] presents for an annual well visit and physical exam. she has a history of anemia, hypertension, and benign colonic polyps. .  No major complaints, takes all medications as prescribed.pt does have h/o normocytic anemia. Pt did have EGD and colonoscopy in 2012 which showed a benign polyp and chronic gastritis. She seems Hematology for anemia as well. No other problems. Denies any fever or chills She is due for a colonoscopy as of June 2022. Her mammogram is due as well. She has had 2 doses of the COVID vaccine. She has not had the shingles vaccine yet.    Current Medication: Outpatient Encounter Medications as of 03/14/2021  Medication Sig   amLODipine (NORVASC) 10 MG tablet TAKE 1 TABLET(10 MG) BY MOUTH DAILY   Cholecalciferol (VITAMIN D-3) 5000 units TABS Take 5,000 Units by mouth.   Docusate Sodium (STOOL SOFTENER LAXATIVE PO) Take by mouth as needed.   folic acid (FOLVITE) 1 MG tablet TAKE 1 TABLET BY MOUTH EVERY DAY   HYDROcodone-acetaminophen (NORCO/VICODIN) 5-325 MG tablet 1-2 tabs po qd prn   lisinopril (ZESTRIL) 40 MG tablet TAKE 1 TABLET(40 MG) BY MOUTH DAILY   Multiple Vitamins-Iron (MULTIVITAMIN/IRON PO) Take 1 capsule by mouth daily.   niacin (NIASPAN) 500 MG CR tablet TAKE 1 TABLET(500 MG) BY MOUTH AT BEDTIME   pantoprazole (PROTONIX) 40 MG tablet Take 1 tablet (40 mg total) by mouth daily.   rosuvastatin (CRESTOR) 5 MG tablet Take 1 tablet (5 mg total) by mouth daily.   Saccharomyces boulardii (FLORASTOR PO) Take 1 capsule by mouth 3 (three) times a week.   triamcinolone cream (KENALOG) 0.1 % Apply 1 application topically 2 (two) times daily.   vitamin B-12  (CYANOCOBALAMIN) 1000 MCG tablet Take 1,000 mcg by mouth daily.   No facility-administered encounter medications on file as of 03/14/2021.    Surgical History: Past Surgical History:  Procedure Laterality Date   BREAST BIOPSY Right Age 54   BREAST BIOPSY Left 07-21-12   Core biopsy showed atypical lobular hyperplasia and a 2 mm foci, atypical ductal hyperplasia and a 1 mm foci. There was no evidence of in situ or invasive cancer   BREAST CYST ASPIRATION  August 2013   inconclusive, with atypical ductal cells identified   COLONOSCOPY  2002, 2005, 2009, 2012   polyps    TOTAL ABDOMINAL HYSTERECTOMY W/ BILATERAL SALPINGOOPHORECTOMY  Age 56    Medical History: Past Medical History:  Diagnosis Date   Anemia    Breast screening, unspecified    Eczema    Family history of colonic polyps    Lump or mass in breast    Neoplasm of uncertain behavior of breast    Personal history of colonic polyps    Special screening for malignant neoplasms, colon    Unspecified essential hypertension     Family History: Family History  Problem Relation Age of Onset   Colon cancer Father    Colon cancer Other        Paternal first cousin   Hypertension Mother    Anemia Mother     Social History   Socioeconomic History   Marital status: Married  Spouse name: Not on file   Number of children: Not on file   Years of education: Not on file   Highest education level: Not on file  Occupational History   Not on file  Tobacco Use   Smoking status: Never   Smokeless tobacco: Never  Vaping Use   Vaping Use: Never used  Substance and Sexual Activity   Alcohol use: Not Currently   Drug use: No   Sexual activity: Not on file  Other Topics Concern   Not on file  Social History Narrative   Not on file   Social Determinants of Health   Financial Resource Strain: Not on file  Food Insecurity: Not on file  Transportation Needs: Not on file  Physical Activity: Not on file  Stress: Not on  file  Social Connections: Not on file  Intimate Partner Violence: Not on file      Review of Systems  Constitutional:  Negative for activity change, appetite change, chills, fatigue, fever and unexpected weight change.  HENT: Negative.  Negative for congestion, ear pain, rhinorrhea, sneezing, sore throat and trouble swallowing.   Eyes: Negative.  Negative for redness.  Respiratory: Negative.  Negative for cough, chest tightness, shortness of breath and wheezing.   Cardiovascular: Negative.  Negative for chest pain and palpitations.  Gastrointestinal: Negative.  Negative for abdominal pain, blood in stool, constipation, diarrhea, nausea and vomiting.  Endocrine: Negative.   Genitourinary: Negative.  Negative for difficulty urinating, dysuria, frequency, hematuria and urgency.  Musculoskeletal: Negative.  Negative for arthralgias, back pain, joint swelling, myalgias and neck pain.  Skin: Negative.  Negative for rash and wound.  Allergic/Immunologic: Negative.  Negative for immunocompromised state.  Neurological: Negative.  Negative for dizziness, tremors, seizures, numbness and headaches.  Hematological: Negative.  Negative for adenopathy. Does not bruise/bleed easily.  Psychiatric/Behavioral: Negative.  Negative for behavioral problems (Depression), self-injury, sleep disturbance and suicidal ideas. The patient is not nervous/anxious.    Vital Signs: BP 128/68   Pulse 88   Temp 98.7 F (37.1 C)   Resp 16   Ht 5\' 7"  (1.702 m)   Wt 149 lb 6.4 oz (67.8 kg)   SpO2 98%   BMI 23.40 kg/m    Physical Exam Constitutional:      General: She is not in acute distress.    Appearance: Normal appearance. She is well-developed and normal weight. She is not ill-appearing or diaphoretic.  HENT:     Head: Normocephalic and atraumatic.     Right Ear: Tympanic membrane, ear canal and external ear normal.     Left Ear: Tympanic membrane, ear canal and external ear normal.     Nose: Nose normal.  No congestion.     Mouth/Throat:     Mouth: Mucous membranes are moist.     Pharynx: Oropharynx is clear. No oropharyngeal exudate or posterior oropharyngeal erythema.  Eyes:     Extraocular Movements: Extraocular movements intact.     Conjunctiva/sclera: Conjunctivae normal.     Pupils: Pupils are equal, round, and reactive to light.  Neck:     Thyroid: No thyromegaly.     Vascular: No JVD.     Trachea: No tracheal deviation.  Cardiovascular:     Rate and Rhythm: Normal rate and regular rhythm.     Pulses: Normal pulses.     Heart sounds: Normal heart sounds. No murmur heard.   No friction rub. No gallop.  Pulmonary:     Effort: Pulmonary effort is normal. No respiratory  distress.     Breath sounds: Normal breath sounds. No wheezing or rales.  Chest:     Chest wall: No tenderness.  Abdominal:     General: Bowel sounds are normal.     Palpations: Abdomen is soft. There is no mass.     Tenderness: There is no abdominal tenderness. There is no guarding or rebound.     Hernia: No hernia is present.  Musculoskeletal:        General: Normal range of motion.     Cervical back: Normal range of motion and neck supple.  Lymphadenopathy:     Cervical: No cervical adenopathy.  Skin:    General: Skin is warm and dry.     Capillary Refill: Capillary refill takes less than 2 seconds.  Neurological:     Mental Status: She is alert and oriented to person, place, and time.  Psychiatric:        Mood and Affect: Mood normal.        Behavior: Behavior normal.        Thought Content: Thought content normal.        Judgment: Judgment normal.       Assessment/Plan: 1. Encounter for general adult medical examination with abnormal findings Age-appropriate preventive screenings discussed, annual physical exam completed. Declined labs since they were done in November 2021, would like to wait until November 2022 to have them done again.   2. Essential hypertension, benign Well controlled with  current medications, amlodipine and lisinopril   3. Gastroesophageal reflux disease without esophagitis Takes pantoprazole, well controlled, no changes.  4. Mixed hyperlipidemia Taking rosuvastatin 5 mg daily, LDL is 128, VLDL is 63, total cholesterol is 262 and triglycerides are 357. Continue medication as prescribed.   5. Dysuria Routine urinalysis done.  - UA/M w/rflx Culture, Routine     General Counseling: Zoiey verbalizes understanding of the findings of todays visit and agrees with plan of treatment. I have discussed any further diagnostic evaluation that may be needed or ordered today. We also reviewed her medications today. she has been encouraged to call the office with any questions or concerns that should arise related to todays visit.    Orders Placed This Encounter  Procedures   Microscopic Examination   UA/M w/rflx Culture, Routine    No orders of the defined types were placed in this encounter.   Return in about 1 year (around 03/14/2022) for CPE, Reader PCP.   Total time spent:30 Minutes Time spent includes review of chart, medications, test results, and follow up plan with the patient.   [place] Controlled Substance Database was reviewed by me.  This patient was seen by Jonetta Osgood, FNP-C in collaboration with Dr. Clayborn Bigness as a part of collaborative care agreement.  Lucillia Corson R. Valetta Fuller, MSN, FNP-C Internal medicine

## 2021-03-15 LAB — UA/M W/RFLX CULTURE, ROUTINE
Bilirubin, UA: NEGATIVE
Glucose, UA: NEGATIVE
Ketones, UA: NEGATIVE
Leukocytes,UA: NEGATIVE
Nitrite, UA: NEGATIVE
Protein,UA: NEGATIVE
RBC, UA: NEGATIVE
Specific Gravity, UA: 1.019 (ref 1.005–1.030)
Urobilinogen, Ur: 0.2 mg/dL (ref 0.2–1.0)
pH, UA: 5.5 (ref 5.0–7.5)

## 2021-03-15 LAB — MICROSCOPIC EXAMINATION
Bacteria, UA: NONE SEEN
Casts: NONE SEEN /lpf
Epithelial Cells (non renal): NONE SEEN /hpf (ref 0–10)
RBC, Urine: NONE SEEN /hpf (ref 0–2)
WBC, UA: NONE SEEN /hpf (ref 0–5)

## 2021-03-20 DIAGNOSIS — H524 Presbyopia: Secondary | ICD-10-CM | POA: Diagnosis not present

## 2021-03-20 DIAGNOSIS — H52223 Regular astigmatism, bilateral: Secondary | ICD-10-CM | POA: Diagnosis not present

## 2021-03-20 DIAGNOSIS — H5203 Hypermetropia, bilateral: Secondary | ICD-10-CM | POA: Diagnosis not present

## 2021-03-21 DIAGNOSIS — E782 Mixed hyperlipidemia: Secondary | ICD-10-CM | POA: Insufficient documentation

## 2021-04-11 ENCOUNTER — Telehealth: Payer: Self-pay | Admitting: Oncology

## 2021-04-11 NOTE — Telephone Encounter (Signed)
Spoke with patient about rescheduling appt on 7/19. Patient is agreeable to come in on 8/9. Mailing updated AVS also.

## 2021-04-17 ENCOUNTER — Other Ambulatory Visit: Payer: Self-pay | Admitting: Internal Medicine

## 2021-04-17 DIAGNOSIS — I10 Essential (primary) hypertension: Secondary | ICD-10-CM

## 2021-04-18 ENCOUNTER — Inpatient Hospital Stay: Payer: BC Managed Care – PPO | Admitting: Oncology

## 2021-04-18 ENCOUNTER — Inpatient Hospital Stay: Payer: BC Managed Care – PPO

## 2021-05-09 ENCOUNTER — Inpatient Hospital Stay: Payer: BC Managed Care – PPO

## 2021-05-09 ENCOUNTER — Inpatient Hospital Stay: Payer: BC Managed Care – PPO | Admitting: Oncology

## 2021-05-27 ENCOUNTER — Other Ambulatory Visit: Payer: Self-pay | Admitting: Oncology

## 2021-05-29 ENCOUNTER — Inpatient Hospital Stay (HOSPITAL_BASED_OUTPATIENT_CLINIC_OR_DEPARTMENT_OTHER): Payer: BC Managed Care – PPO | Admitting: Oncology

## 2021-05-29 ENCOUNTER — Other Ambulatory Visit: Payer: Self-pay

## 2021-05-29 ENCOUNTER — Telehealth: Payer: Self-pay

## 2021-05-29 ENCOUNTER — Other Ambulatory Visit: Payer: Self-pay | Admitting: *Deleted

## 2021-05-29 ENCOUNTER — Encounter: Payer: Self-pay | Admitting: Oncology

## 2021-05-29 ENCOUNTER — Inpatient Hospital Stay: Payer: BC Managed Care – PPO | Attending: Oncology

## 2021-05-29 VITALS — BP 127/64 | HR 70 | Temp 98.2°F | Resp 16 | Ht 67.0 in | Wt 155.7 lb

## 2021-05-29 DIAGNOSIS — D649 Anemia, unspecified: Secondary | ICD-10-CM | POA: Insufficient documentation

## 2021-05-29 DIAGNOSIS — E538 Deficiency of other specified B group vitamins: Secondary | ICD-10-CM

## 2021-05-29 LAB — CBC WITH DIFFERENTIAL/PLATELET
Abs Immature Granulocytes: 0.01 10*3/uL (ref 0.00–0.07)
Basophils Absolute: 0 10*3/uL (ref 0.0–0.1)
Basophils Relative: 1 %
Eosinophils Absolute: 0.1 10*3/uL (ref 0.0–0.5)
Eosinophils Relative: 3 %
HCT: 27.5 % — ABNORMAL LOW (ref 36.0–46.0)
Hemoglobin: 8.9 g/dL — ABNORMAL LOW (ref 12.0–15.0)
Immature Granulocytes: 0 %
Lymphocytes Relative: 32 %
Lymphs Abs: 1.5 10*3/uL (ref 0.7–4.0)
MCH: 26.3 pg (ref 26.0–34.0)
MCHC: 32.4 g/dL (ref 30.0–36.0)
MCV: 81.1 fL (ref 80.0–100.0)
Monocytes Absolute: 0.3 10*3/uL (ref 0.1–1.0)
Monocytes Relative: 7 %
Neutro Abs: 2.6 10*3/uL (ref 1.7–7.7)
Neutrophils Relative %: 57 %
Platelets: 286 10*3/uL (ref 150–400)
RBC: 3.39 MIL/uL — ABNORMAL LOW (ref 3.87–5.11)
RDW: 13.9 % (ref 11.5–15.5)
WBC: 4.6 10*3/uL (ref 4.0–10.5)
nRBC: 0 % (ref 0.0–0.2)

## 2021-05-29 LAB — COMPREHENSIVE METABOLIC PANEL
ALT: 14 U/L (ref 0–44)
AST: 19 U/L (ref 15–41)
Albumin: 4.3 g/dL (ref 3.5–5.0)
Alkaline Phosphatase: 36 U/L — ABNORMAL LOW (ref 38–126)
Anion gap: 12 (ref 5–15)
BUN: 18 mg/dL (ref 8–23)
CO2: 22 mmol/L (ref 22–32)
Calcium: 9.3 mg/dL (ref 8.9–10.3)
Chloride: 105 mmol/L (ref 98–111)
Creatinine, Ser: 0.6 mg/dL (ref 0.44–1.00)
GFR, Estimated: >60 mL/min (ref >60)
Glucose, Bld: 83 mg/dL (ref 70–99)
Potassium: 3.4 mmol/L — ABNORMAL LOW (ref 3.5–5.1)
Sodium: 139 mmol/L (ref 135–145)
Total Bilirubin: 0.8 mg/dL (ref 0.3–1.2)
Total Protein: 7.4 g/dL (ref 6.5–8.1)

## 2021-05-29 LAB — SEDIMENTATION RATE: Sed Rate: 37 mm/hr — ABNORMAL HIGH (ref 0–30)

## 2021-05-29 LAB — IRON AND TIBC
Iron: 137 ug/dL (ref 28–170)
Saturation Ratios: 43 % — ABNORMAL HIGH (ref 10.4–31.8)
TIBC: 319 ug/dL (ref 250–450)
UIBC: 182 ug/dL

## 2021-05-29 LAB — VITAMIN B12: Vitamin B-12: 429 pg/mL (ref 180–914)

## 2021-05-29 LAB — LACTATE DEHYDROGENASE: LDH: 138 U/L (ref 98–192)

## 2021-05-29 LAB — FERRITIN: Ferritin: 495 ng/mL — ABNORMAL HIGH (ref 11–307)

## 2021-05-29 NOTE — Progress Notes (Signed)
Hematology/Oncology Consult note [facility]  [phone] [phone]  Patient Care Team: Jonetta Osgood, NP as PCP - General (Nurse Practitioner) Robert Bellow, MD (General Surgery) Sindy Guadeloupe, MD as Consulting Physician (Oncology)   Name of the patient: Laurie Gross  [id number]  11-26-1955   Date of visit: 05/29/21  Diagnosis-normocytic anemia of unclear etiology  Chief complaint/ Reason for visit-routine follow-up of anemia  Heme/Onc history: patient is a 65 year old African-American female with a past medical history significant for hypertension, hyperlipidemia and GERD among other medical problems.  She has been referred to [place] for evaluation and management of anemia as well as elevated ferritin.   Results of blood work from 12/09/2017 were as follows: CBC showed white count of 5.5, H&H of 10.1/30.7 with an MCV of 82.1 and a platelet count of 356.  Differential was normal.  CMP was essentially normal except for a mildly low potassium of 3.3 and elevated blood sugar of 150.  LFTs and creatinine was normal serum calcium was normal.  Ferritin levels were mildly elevated at 443 lower compared to a prior value of 610.  Iron studies were within normal limits.  Folate was mildly low at 5.3.  B12 levels were low at 192.  ESR was normal and ANA comprehensive panel was negative.  Multiple myeloma panel revealed no monoclonal protein.  Polyclonal gammopathy was noted.  Serum free light chains revealed mildly elevated kappa of 24 with a normal kappa lambda ratio 1.2.  Reticulocyte count was low normal at 1.8.   Patient had bone marrow biopsy in October 2021 which showed normocellular marrow with trilineage hematopoiesis.  Iron stores increased with only rare ring sideroblasts.  Polyclonal increase in plasma cells 7%.  Normal karyotype MDS FISH analysis negative.      Interval history-patient reports doing well and denies any complaints at  this time.  Appetite and weight have remained stable.  Denies any significant fatigue drenching night sweats  ECOG PS- 0 Pain scale- 0   Review of systems- Review of Systems  Constitutional:  Negative for chills, fever, malaise/fatigue and weight loss.  HENT:  Negative for congestion, ear discharge and nosebleeds.   Eyes:  Negative for blurred vision.  Respiratory:  Negative for cough, hemoptysis, sputum production, shortness of breath and wheezing.   Cardiovascular:  Negative for chest pain, palpitations, orthopnea and claudication.  Gastrointestinal:  Negative for abdominal pain, blood in stool, constipation, diarrhea, heartburn, melena, nausea and vomiting.  Genitourinary:  Negative for dysuria, flank pain, frequency, hematuria and urgency.  Musculoskeletal:  Negative for back pain, joint pain and myalgias.  Skin:  Negative for rash.  Neurological:  Negative for dizziness, tingling, focal weakness, seizures, weakness and headaches.  Endo/Heme/Allergies:  Does not bruise/bleed easily.  Psychiatric/Behavioral:  Negative for depression and suicidal ideas. The patient does not have insomnia.       Allergies  Allergen Reactions   Hydrochlorothiazide Other (See Comments)    History of pancreatitis   Amoxicillin Nausea Only   Meloxicam Other (See Comments)   Penicillins Nausea Only   Simvastatin Nausea Only   Sulfamethoxazole-Trimethoprim Nausea Only   Tramadol Nausea Only     Past Medical History:  Diagnosis Date   Anemia    Breast screening, unspecified    Eczema    Family history of colonic polyps    Lump or mass in breast    Neoplasm of uncertain behavior of breast    Personal history of colonic polyps  Special screening for malignant neoplasms, colon    Unspecified essential hypertension      Past Surgical History:  Procedure Laterality Date   BREAST BIOPSY Right Age 19   BREAST BIOPSY Left 07-21-12   Core biopsy showed atypical lobular hyperplasia and a 2 mm  foci, atypical ductal hyperplasia and a 1 mm foci. There was no evidence of in situ or invasive cancer   BREAST CYST ASPIRATION  August 2013   inconclusive, with atypical ductal cells identified   COLONOSCOPY  2002, 2005, 2009, 2012   polyps    TOTAL ABDOMINAL HYSTERECTOMY W/ BILATERAL SALPINGOOPHORECTOMY  Age 56    Social History   Socioeconomic History   Marital status: Married    Spouse name: Not on file   Number of children: Not on file   Years of education: Not on file   Highest education level: Not on file  Occupational History   Not on file  Tobacco Use   Smoking status: Never   Smokeless tobacco: Never  Vaping Use   Vaping Use: Never used  Substance and Sexual Activity   Alcohol use: Not Currently   Drug use: No   Sexual activity: Not Currently  Other Topics Concern   Not on file  Social History Narrative   Not on file   Social Determinants of Health   Financial Resource Strain: Not on file  Food Insecurity: Not on file  Transportation Needs: Not on file  Physical Activity: Not on file  Stress: Not on file  Social Connections: Not on file  Intimate Partner Violence: Not on file    Family History  Problem Relation Age of Onset   Colon cancer Father    Colon cancer Other        Paternal first cousin   Hypertension Mother    Anemia Mother      Current Outpatient Medications:    amLODipine (NORVASC) 10 MG tablet, TAKE 1 TABLET(10 MG) BY MOUTH DAILY, Disp: 90 tablet, Rfl: 1   Cholecalciferol (VITAMIN D-3) 5000 units TABS, Take 5,000 Units by mouth daily., Disp: , Rfl:    Docusate Sodium (STOOL SOFTENER LAXATIVE PO), Take 1 Dose by mouth as needed., Disp: , Rfl:    folic acid (FOLVITE) 1 MG tablet, TAKE 1 TABLET BY MOUTH EVERY DAY, Disp: 90 tablet, Rfl: 1   lisinopril (ZESTRIL) 40 MG tablet, TAKE 1 TABLET BY MOUTH DAILY, Disp: 90 tablet, Rfl: 0   Multiple Vitamins-Iron (MULTIVITAMIN/IRON PO), Take 1 capsule by mouth daily., Disp: , Rfl:    niacin  (NIASPAN) 500 MG CR tablet, TAKE 1 TABLET(500 MG) BY MOUTH AT BEDTIME, Disp: 90 tablet, Rfl: 1   pantoprazole (PROTONIX) 40 MG tablet, Take 1 tablet (40 mg total) by mouth daily., Disp: 30 tablet, Rfl: 12   rosuvastatin (CRESTOR) 5 MG tablet, Take 1 tablet (5 mg total) by mouth daily., Disp: 90 tablet, Rfl: 3   Saccharomyces boulardii (FLORASTOR PO), Take 1 capsule by mouth 3 (three) times a week., Disp: , Rfl:    triamcinolone cream (KENALOG) 0.1 %, Apply 1 application topically 2 (two) times daily. (Patient taking differently: Apply 1 application topically as needed.), Disp: 30 g, Rfl: 0   vitamin B-12 (CYANOCOBALAMIN) 1000 MCG tablet, Take 1,000 mcg by mouth daily., Disp: , Rfl:   Physical exam:  Vitals:   05/29/21 0945  BP: 127/64  Pulse: 70  Resp: 16  Temp: 98.2 F (36.8 C)  TempSrc: Oral  Weight: 155 lb 11.2 oz (70.6 kg)  Height: 5' 7"  (1.702 m)   Physical Exam Cardiovascular:     Rate and Rhythm: Normal rate and regular rhythm.     Heart sounds: Normal heart sounds.  Pulmonary:     Effort: Pulmonary effort is normal.     Breath sounds: Normal breath sounds.  Abdominal:     General: Bowel sounds are normal.     Palpations: Abdomen is soft.     Comments: No palpable hepatosplenomegaly  Lymphadenopathy:     Comments: No palpable cervical, supraclavicular, axillary or inguinal adenopathy    Skin:    General: Skin is warm and dry.  Neurological:     Mental Status: She is alert and oriented to person, place, and time.     CMP Latest Ref Rng & Units 08/11/2020  Glucose 65 - 99 mg/dL 84  BUN 8 - 27 mg/dL 11  Creatinine 0.57 - 1.00 mg/dL 0.63  Sodium 134 - 144 mmol/L 143  Potassium 3.5 - 5.2 mmol/L 3.9  Chloride 96 - 106 mmol/L 103  CO2 20 - 29 mmol/L 22  Calcium 8.7 - 10.3 mg/dL 9.7  Total Protein 6.0 - 8.5 g/dL 7.3  Total Bilirubin 0.0 - 1.2 mg/dL 0.5  Alkaline Phos 44 - 121 IU/L 50  AST 0 - 40 IU/L 25  ALT 0 - 32 IU/L 14   CBC Latest Ref Rng & Units 05/29/2021   WBC 4.0 - 10.5 K/uL 4.6  Hemoglobin 12.0 - 15.0 g/dL 8.9(L)  Hematocrit 36.0 - 46.0 % 27.5(L)  Platelets 150 - 400 K/uL 286    No images are attached to the encounter.  No results found.   Assessment and plan- Patient is a 65 y.o. female here for routine follow-up of normocytic anemia  Patient has had baseline normocytic/microcytic anemia with a hemoglobin that has remained around 10 at least since 2019.  An entire anemia work-up including iron studies B12 folate reticulocyte count TSH myeloma panel as well as bone marrow biopsy was done which did not show any discerning etiology for her anemia.  Bone marrow biopsy was back in October 2021.  Her anemia was being monitored conservatively but she was noted to have a further decline in her hemoglobin to 8.9 in April 2022 and a repeat level again after 4 months show similar findings with no improvement.  I have checked iron studies again today which shows elevated ferritin of 495 which has again remained constant over the last 3 years.  No evidence of iron deficiency.  ESR mildly elevated at 37.  ANA B1 B12 B6 levels have been repeated today are pending.  No palpable adenopathy or hepatosplenomegaly clinically.  LDH is normal.  Given that her anemia has worsened and there is no overt etiology after and I will refer her to [facility] benign hematology for second opinion.  I will see her back in 3 months with repeat labs   Visit Diagnosis 1. Normocytic anemia   2. B12 deficiency   3. Folate deficiency      Dr. Randa Evens, MD, MPH [facility] [phone] 05/29/2021 1:01 PM

## 2021-05-29 NOTE — Progress Notes (Signed)
Pt feels good, no concerns

## 2021-05-30 LAB — ANA COMPREHENSIVE PANEL
Anti JO-1: <0.2 AI (ref 0.0–0.9)
Centromere Ab Screen: <0.2 AI (ref 0.0–0.9)
Chromatin Ab SerPl-aCnc: <0.2 AI (ref 0.0–0.9)
ENA SM Ab Ser-aCnc: <0.2 AI (ref 0.0–0.9)
Ribonucleic Protein: <0.2 AI (ref 0.0–0.9)
SSA (Ro) (ENA) Antibody, IgG: <0.2 AI (ref 0.0–0.9)
SSB (La) (ENA) Antibody, IgG: <0.2 AI (ref 0.0–0.9)
Scleroderma (Scl-70) (ENA) Antibody, IgG: <0.2 AI (ref 0.0–0.9)
ds DNA Ab: 1 IU/mL (ref 0–9)

## 2021-05-31 LAB — VITAMIN B6: Vitamin B6: 21.8 ug/L (ref 3.4–65.2)

## 2021-06-08 LAB — VITAMIN B1: Vitamin B1 (Thiamine): 78.9 nmol/L (ref 66.5–200.0)

## 2021-07-27 ENCOUNTER — Other Ambulatory Visit: Payer: Self-pay | Admitting: Nurse Practitioner

## 2021-07-27 ENCOUNTER — Other Ambulatory Visit: Payer: Self-pay | Admitting: Internal Medicine

## 2021-07-27 DIAGNOSIS — I10 Essential (primary) hypertension: Secondary | ICD-10-CM

## 2021-07-29 IMAGING — CR DG RIBS W/ CHEST 3+V*L*
5 series · 5 of 5 positions shown · non-contrast
Comparison: 09/04/2017

CLINICAL DATA: Fall.  Left chest pain

EXAM:
LEFT RIBS AND CHEST - 3+ VIEW

[chest pa]
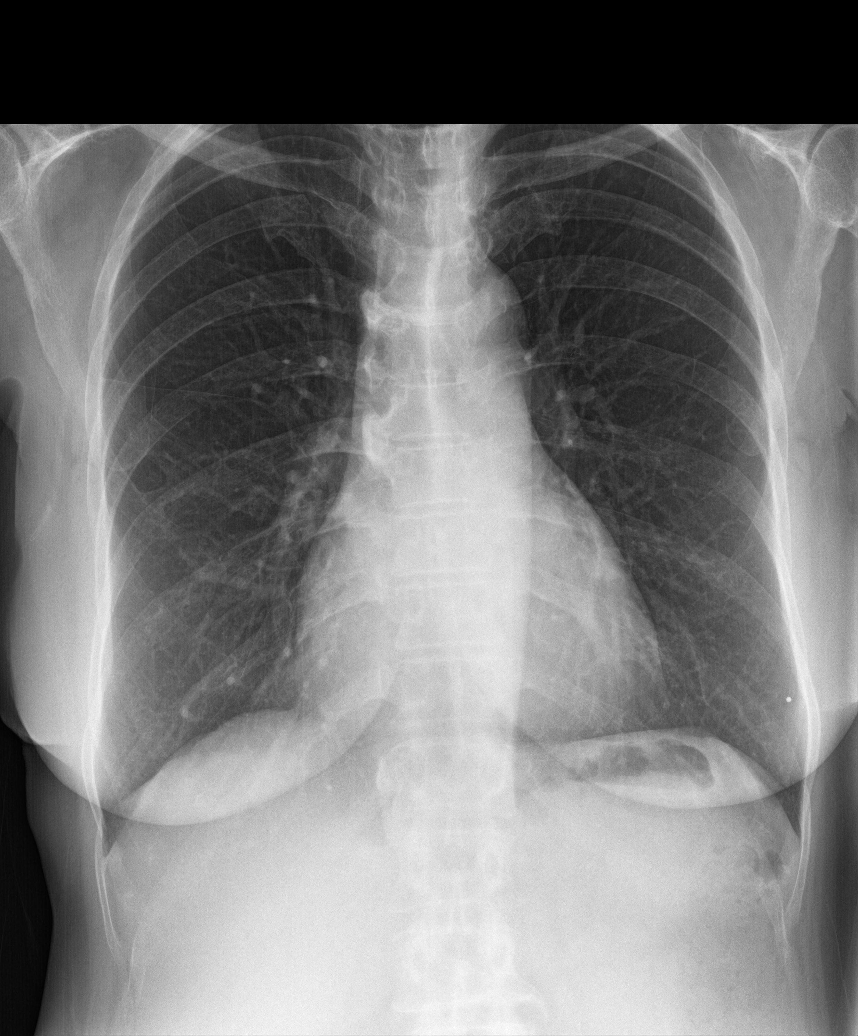

[rib pa (1 of 2)]
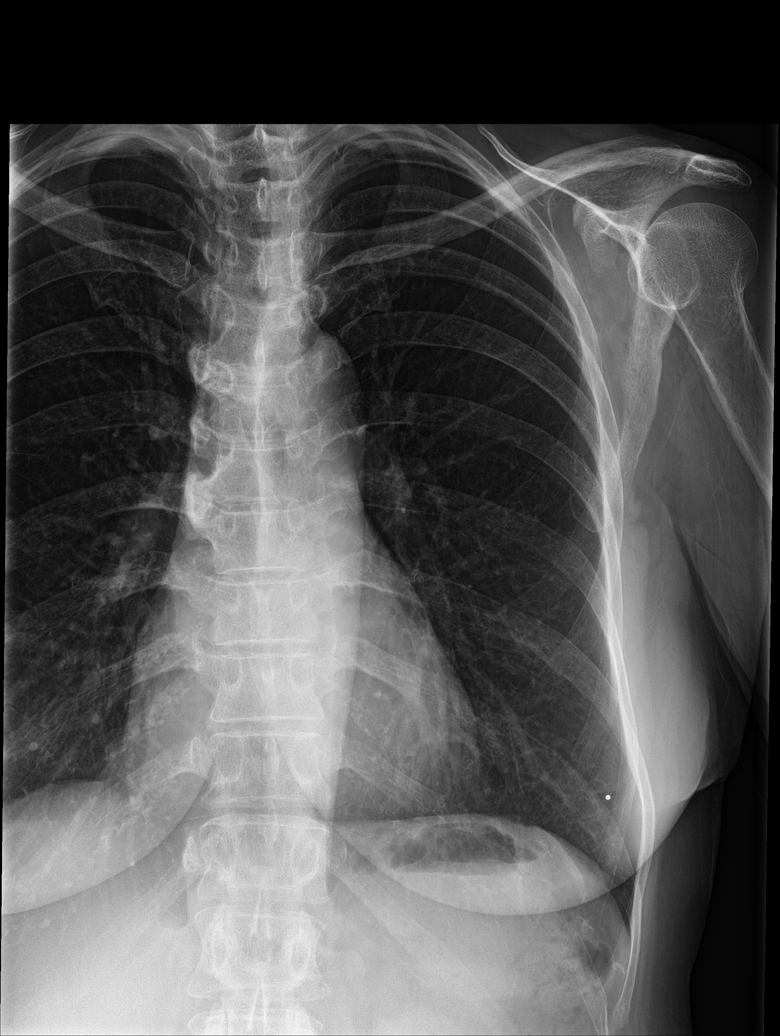

[rib pa (2 of 2)]
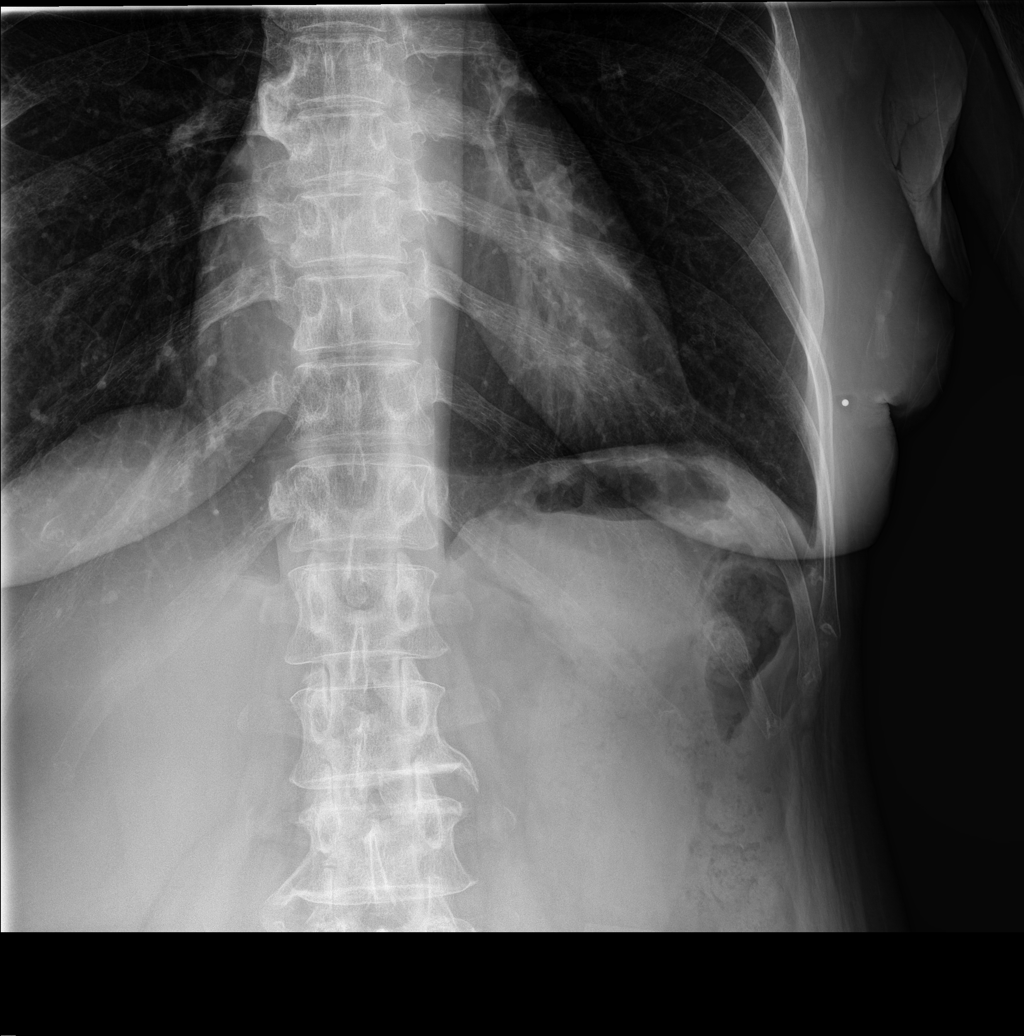

[rib obl (1 of 2)]
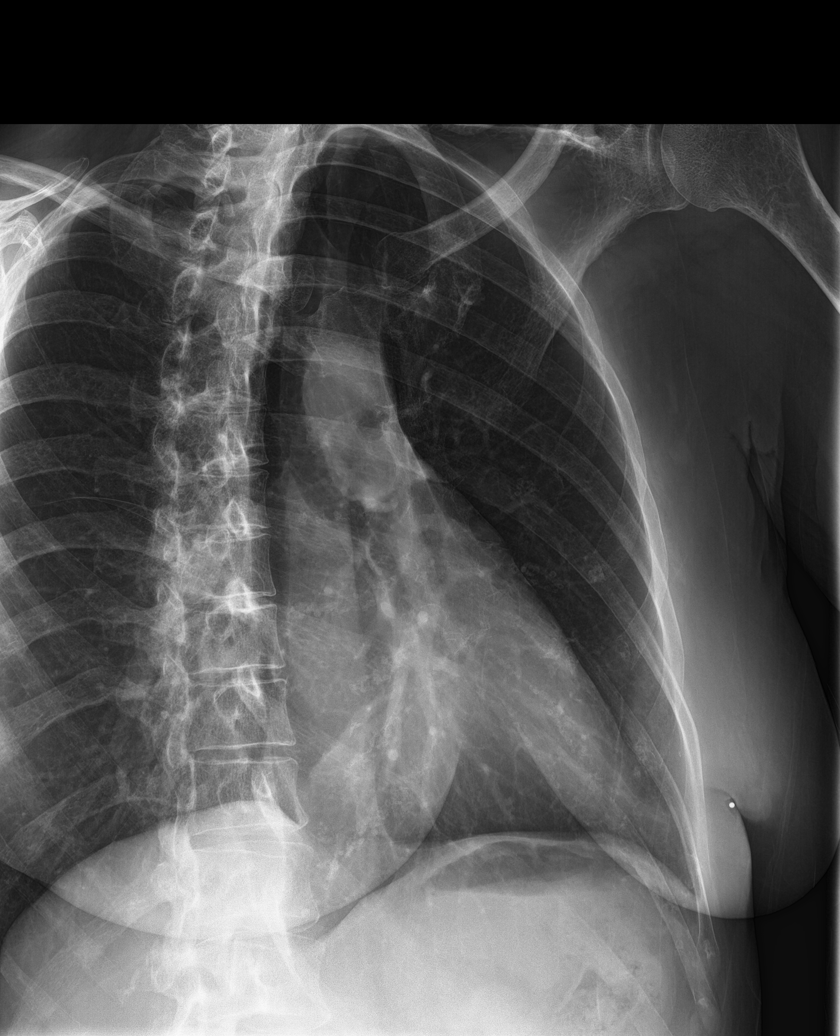

[rib obl (2 of 2)]
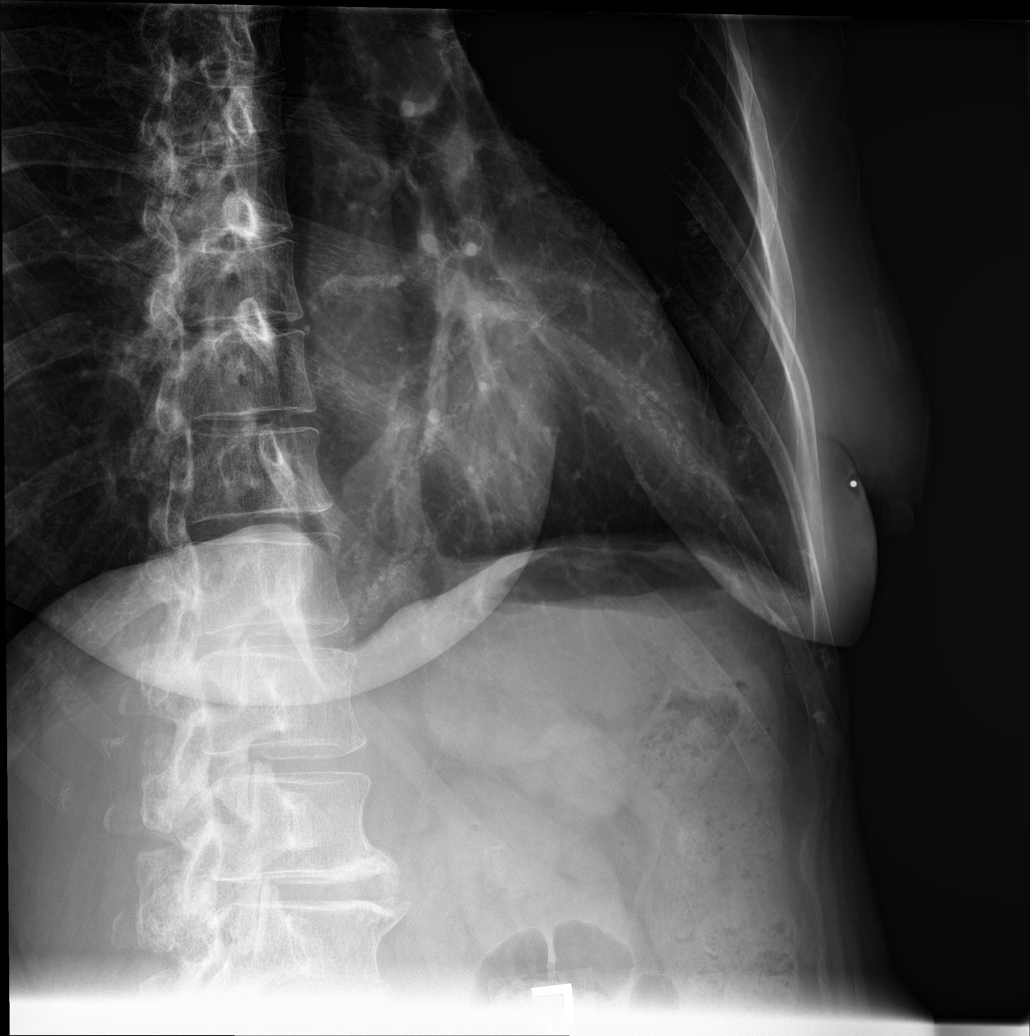

[5 of 5 positions shown; findings below may reference images not displayed]

FINDINGS: No fracture or other bone lesions are seen involving the ribs. There
is no evidence of pneumothorax or pleural effusion. Both lungs are
clear. Heart size and mediastinal contours are within normal limits.
IMPRESSION: Negative.

## 2021-08-08 ENCOUNTER — Other Ambulatory Visit: Payer: Self-pay | Admitting: Internal Medicine

## 2021-08-08 DIAGNOSIS — E782 Mixed hyperlipidemia: Secondary | ICD-10-CM

## 2021-08-29 ENCOUNTER — Inpatient Hospital Stay: Payer: Self-pay | Admitting: Oncology

## 2021-08-29 ENCOUNTER — Inpatient Hospital Stay: Payer: Self-pay | Attending: Oncology

## 2021-08-30 NOTE — Telephone Encounter (Signed)
Signing previous note, see note on 8/29

## 2021-09-04 ENCOUNTER — Telehealth: Payer: Self-pay

## 2021-09-05 ENCOUNTER — Other Ambulatory Visit: Payer: Self-pay | Admitting: Nurse Practitioner

## 2021-09-05 DIAGNOSIS — E782 Mixed hyperlipidemia: Secondary | ICD-10-CM

## 2021-09-05 NOTE — Telephone Encounter (Signed)
As per alyssa just finished niacin for this month ok to stopped and continue crestor and repeat labs next month make sure its fasting labs if we need she her to add med we schedule her appt

## 2021-09-15 ENCOUNTER — Other Ambulatory Visit: Payer: Self-pay | Admitting: Internal Medicine

## 2021-10-06 ENCOUNTER — Other Ambulatory Visit: Payer: Self-pay

## 2021-10-06 ENCOUNTER — Telehealth: Payer: Self-pay

## 2021-10-06 DIAGNOSIS — K2100 Gastro-esophageal reflux disease with esophagitis, without bleeding: Secondary | ICD-10-CM

## 2021-10-06 MED ORDER — PANTOPRAZOLE SODIUM 40 MG PO TBEC: 40.0000 mg | DELAYED_RELEASE_TABLET | Freq: Two times a day (BID) | ORAL | 0 refills | Status: DC

## 2021-10-06 NOTE — Telephone Encounter (Signed)
Pt called that she was taking pantoprazole BID given by dr Humphrey Rolls as per alyssa change to bid and end to [facility]

## 2021-10-19 DIAGNOSIS — Z6825 Body mass index (BMI) 25.0-25.9, adult: Secondary | ICD-10-CM | POA: Diagnosis not present

## 2021-10-19 DIAGNOSIS — D649 Anemia, unspecified: Secondary | ICD-10-CM | POA: Diagnosis not present

## 2021-12-05 ENCOUNTER — Telehealth: Payer: Self-pay | Admitting: *Deleted

## 2021-12-05 ENCOUNTER — Other Ambulatory Visit: Payer: Self-pay | Admitting: *Deleted

## 2021-12-05 DIAGNOSIS — E538 Deficiency of other specified B group vitamins: Secondary | ICD-10-CM

## 2021-12-05 DIAGNOSIS — D649 Anemia, unspecified: Secondary | ICD-10-CM

## 2021-12-05 NOTE — Telephone Encounter (Signed)
Patient called to find out if she will be scheduled to see Dr. Janese Banks again. She has seen a specialist at Dr. Janese Banks request he advised that she could continue with Dr. Janese Banks. ?

## 2021-12-05 NOTE — Telephone Encounter (Signed)
Labs are changed for the new date ?

## 2021-12-27 ENCOUNTER — Other Ambulatory Visit: Payer: Self-pay | Admitting: Internal Medicine

## 2021-12-27 DIAGNOSIS — E782 Mixed hyperlipidemia: Secondary | ICD-10-CM

## 2022-01-08 ENCOUNTER — Inpatient Hospital Stay (HOSPITAL_BASED_OUTPATIENT_CLINIC_OR_DEPARTMENT_OTHER): Payer: Self-pay | Admitting: Oncology

## 2022-01-08 ENCOUNTER — Inpatient Hospital Stay: Payer: Self-pay | Attending: Oncology

## 2022-01-08 ENCOUNTER — Encounter: Payer: Self-pay | Admitting: Oncology

## 2022-01-08 VITALS — BP 138/65 | HR 77 | Temp 98.8°F | Resp 16 | Ht 67.0 in | Wt 168.0 lb

## 2022-01-08 DIAGNOSIS — E538 Deficiency of other specified B group vitamins: Secondary | ICD-10-CM | POA: Insufficient documentation

## 2022-01-08 DIAGNOSIS — D649 Anemia, unspecified: Secondary | ICD-10-CM | POA: Insufficient documentation

## 2022-01-08 LAB — CBC WITH DIFFERENTIAL/PLATELET
Abs Immature Granulocytes: 0.02 10*3/uL (ref 0.00–0.07)
Basophils Absolute: 0 10*3/uL (ref 0.0–0.1)
Basophils Relative: 1 %
Eosinophils Absolute: 0.2 10*3/uL (ref 0.0–0.5)
Eosinophils Relative: 3 %
HCT: 30 % — ABNORMAL LOW (ref 36.0–46.0)
Hemoglobin: 9.3 g/dL — ABNORMAL LOW (ref 12.0–15.0)
Immature Granulocytes: 0 %
Lymphocytes Relative: 22 %
Lymphs Abs: 1.5 10*3/uL (ref 0.7–4.0)
MCH: 25.2 pg — ABNORMAL LOW (ref 26.0–34.0)
MCHC: 31 g/dL (ref 30.0–36.0)
MCV: 81.3 fL (ref 80.0–100.0)
Monocytes Absolute: 0.5 10*3/uL (ref 0.1–1.0)
Monocytes Relative: 7 %
Neutro Abs: 4.7 10*3/uL (ref 1.7–7.7)
Neutrophils Relative %: 67 %
Platelets: 337 10*3/uL (ref 150–400)
RBC: 3.69 MIL/uL — ABNORMAL LOW (ref 3.87–5.11)
RDW: 15.5 % (ref 11.5–15.5)
WBC: 7 10*3/uL (ref 4.0–10.5)
nRBC: 0.3 % — ABNORMAL HIGH (ref 0.0–0.2)

## 2022-01-08 LAB — IRON AND TIBC
Iron: 113 ug/dL (ref 28–170)
Saturation Ratios: 33 % — ABNORMAL HIGH (ref 10.4–31.8)
TIBC: 343 ug/dL (ref 250–450)
UIBC: 230 ug/dL

## 2022-01-08 LAB — FERRITIN: Ferritin: 373 ng/mL — ABNORMAL HIGH (ref 11–307)

## 2022-01-08 LAB — VITAMIN B12: Vitamin B-12: 537 pg/mL (ref 180–914)

## 2022-01-08 LAB — FOLATE: Folate: >100 ng/mL (ref >5.9)

## 2022-01-08 NOTE — Progress Notes (Signed)
? ? ? ?Hematology/Oncology Consult note ?[facility] [employer]  ?[phone] [phone] ? ?Patient Care Team: ?Jonetta Osgood, NP as PCP - General (Nurse Practitioner) ?Robert Bellow, MD (General Surgery) ?Sindy Guadeloupe, MD as Consulting Physician (Oncology)  ? ?Name of the patient: Laurie Gross  ?[id number]  ?1956/04/30  ? ?Date of visit: 01/08/22 ? ?Diagnosis-normocytic anemia of unclear etiology ? ?Chief complaint/ Reason for visit-routine follow-up of anemia ? ?Heme/Onc history:  patient is a 66 year old African-American female with a past medical history significant for hypertension, hyperlipidemia and GERD among other medical problems.  She has been referred to [place] for evaluation and management of anemia as well as elevated ferritin. ?  ?Results of blood work from 12/09/2017 were as follows: CBC showed white count of 5.5, H&H of 10.1/30.7 with an MCV of 82.1 and a platelet count of 356.  Differential was normal.  CMP was essentially normal except for a mildly low potassium of 3.3 and elevated blood sugar of 150.  LFTs and creatinine was normal serum calcium was normal.  Ferritin levels were mildly elevated at 443 lower compared to a prior value of 610.  Iron studies were within normal limits.  Folate was mildly low at 5.3.  B12 levels were low at 192.  ESR was normal and ANA comprehensive panel was negative.  Multiple myeloma panel revealed no monoclonal protein.  Polyclonal gammopathy was noted.  Serum free light chains revealed mildly elevated kappa of 24 with a normal kappa lambda ratio 1.2.  Reticulocyte count was low normal at 1.8. ?  ?Patient had bone marrow biopsy in October 2021 which showed normocellular marrow with trilineage hematopoiesis.  Iron stores increased with only rare ring sideroblasts.  Polyclonal increase in plasma cells 7%.  Normal karyotype MDS FISH analysis negative. ? ?Patient seen by Dr. Bunnie Domino at [facility] benign hematology and conservative  monitoring of CBC was recommended ?  ? ?Interval history-patient reports feeling at her baseline.  She has mild baseline fatigue but no new complaints.  She remains active and independent of her ADLs and IADLs. ? ?ECOG PS- 1 ?Pain scale- 0 ? ? ?Review of systems- Review of Systems  ?Constitutional:  Negative for chills, fever, malaise/fatigue and weight loss.  ?HENT:  Negative for congestion, ear discharge and nosebleeds.   ?Eyes:  Negative for blurred vision.  ?Respiratory:  Negative for cough, hemoptysis, sputum production, shortness of breath and wheezing.   ?Cardiovascular:  Negative for chest pain, palpitations, orthopnea and claudication.  ?Gastrointestinal:  Negative for abdominal pain, blood in stool, constipation, diarrhea, heartburn, melena, nausea and vomiting.  ?Genitourinary:  Negative for dysuria, flank pain, frequency, hematuria and urgency.  ?Musculoskeletal:  Negative for back pain, joint pain and myalgias.  ?Skin:  Negative for rash.  ?Neurological:  Negative for dizziness, tingling, focal weakness, seizures, weakness and headaches.  ?Endo/Heme/Allergies:  Does not bruise/bleed easily.  ?Psychiatric/Behavioral:  Negative for depression and suicidal ideas. The patient does not have insomnia.    ? ? ?Allergies  ?Allergen Reactions  ? Hydrochlorothiazide Other (See Comments)  ?  History of pancreatitis  ? Amoxicillin Nausea Only  ? Meloxicam Other (See Comments)  ? Penicillins Nausea Only  ? Simvastatin Nausea Only  ? Sulfamethoxazole-Trimethoprim Nausea Only  ? Tramadol Nausea Only  ? ? ? ?Past Medical History:  ?Diagnosis Date  ? Anemia   ? Breast screening, unspecified   ? Eczema   ? Family history of colonic polyps   ? Lump or mass in breast   ?  Neoplasm of uncertain behavior of breast   ? Personal history of colonic polyps   ? Special screening for malignant neoplasms, colon   ? Unspecified essential hypertension   ? ? ? ?Past Surgical History:  ?Procedure Laterality Date  ? BREAST BIOPSY Right  Age 66  ? BREAST BIOPSY Left 07-21-12  ? Core biopsy showed atypical lobular hyperplasia and a 2 mm foci, atypical ductal hyperplasia and a 1 mm foci. There was no evidence of in situ or invasive cancer  ? BREAST CYST ASPIRATION  August 2013  ? inconclusive, with atypical ductal cells identified  ? COLONOSCOPY  2002, 2005, 2009, 2012  ? polyps   ? TOTAL ABDOMINAL HYSTERECTOMY W/ BILATERAL SALPINGOOPHORECTOMY  Age 73  ? ? ?Social History  ? ?Socioeconomic History  ? Marital status: Married  ?  Spouse name: Not on file  ? Number of children: Not on file  ? Years of education: Not on file  ? Highest education level: Not on file  ?Occupational History  ? Not on file  ?Tobacco Use  ? Smoking status: Never  ? Smokeless tobacco: Never  ?Vaping Use  ? Vaping Use: Never used  ?Substance and Sexual Activity  ? Alcohol use: Not Currently  ? Drug use: No  ? Sexual activity: Not Currently  ?Other Topics Concern  ? Not on file  ?Social History Narrative  ? Not on file  ? ?Social Determinants of Health  ? ?Financial Resource Strain: Not on file  ?Food Insecurity: Not on file  ?Transportation Needs: Not on file  ?Physical Activity: Not on file  ?Stress: Not on file  ?Social Connections: Not on file  ?Intimate Partner Violence: Not on file  ? ? ?Family History  ?Problem Relation Age of Onset  ? Colon cancer Father   ? Colon cancer Other   ?     Paternal first cousin  ? Hypertension Mother   ? Anemia Mother   ? ? ? ?Current Outpatient Medications:  ?  amLODipine (NORVASC) 10 MG tablet, TAKE 1 TABLET(10 MG) BY MOUTH DAILY, Disp: 90 tablet, Rfl: 1 ?  Cholecalciferol (VITAMIN D-3) 5000 units TABS, Take 5,000 Units by mouth daily., Disp: , Rfl:  ?  Docusate Sodium (STOOL SOFTENER LAXATIVE PO), Take 1 Dose by mouth as needed., Disp: , Rfl:  ?  folic acid (FOLVITE) 1 MG tablet, TAKE 1 TABLET BY MOUTH EVERY DAY, Disp: 90 tablet, Rfl: 1 ?  lisinopril (ZESTRIL) 40 MG tablet, TAKE 1 TABLET BY MOUTH DAILY, Disp: 90 tablet, Rfl: 1 ?  Multiple  Vitamins-Iron (MULTIVITAMIN/IRON PO), Take 1 capsule by mouth daily., Disp: , Rfl:  ?  niacin (NIASPAN) 500 MG CR tablet, TAKE 1 TABLET(500 MG) BY MOUTH AT BEDTIME, Disp: 90 tablet, Rfl: 0 ?  pantoprazole (PROTONIX) 40 MG tablet, Take 1 tablet (40 mg total) by mouth 2 (two) times daily., Disp: 180 tablet, Rfl: 0 ?  rosuvastatin (CRESTOR) 5 MG tablet, TAKE 1 TABLET(5 MG) BY MOUTH DAILY, Disp: 90 tablet, Rfl: 3 ?  Saccharomyces boulardii (FLORASTOR PO), Take 1 capsule by mouth 3 (three) times a week., Disp: , Rfl:  ?  triamcinolone cream (KENALOG) 0.1 %, Apply 1 application topically 2 (two) times daily. (Patient taking differently: Apply 1 application. topically as needed.), Disp: 30 g, Rfl: 0 ?  vitamin B-12 (CYANOCOBALAMIN) 1000 MCG tablet, Take 1,000 mcg by mouth daily., Disp: , Rfl:  ? ?Physical exam:  ?Vitals:  ? 01/08/22 1122  ?BP: 138/65  ?Pulse: 77  ?Resp:  16  ?Temp: 98.8 ?F (37.1 ?C)  ?TempSrc: Oral  ?Weight: 168 lb (76.2 kg)  ?Height: 5' 7"  (1.702 m)  ? ?Physical Exam ?Constitutional:   ?   General: She is not in acute distress. ?Cardiovascular:  ?   Rate and Rhythm: Regular rhythm. Tachycardia present.  ?   Heart sounds: Normal heart sounds.  ?Pulmonary:  ?   Effort: Pulmonary effort is normal.  ?   Breath sounds: Normal breath sounds.  ?Abdominal:  ?   General: Bowel sounds are normal.  ?   Palpations: Abdomen is soft.  ?Skin: ?   General: Skin is warm and dry.  ?Neurological:  ?   Mental Status: She is alert and oriented to person, place, and time.  ?  ? ? ?  Latest Ref Rng & Units 05/29/2021  ?  9:09 AM  ?CMP  ?Glucose 70 - 99 mg/dL 83    ?BUN 8 - 23 mg/dL 18    ?Creatinine 0.44 - 1.00 mg/dL 0.60    ?Sodium 135 - 145 mmol/L 139    ?Potassium 3.5 - 5.1 mmol/L 3.4    ?Chloride 98 - 111 mmol/L 105    ?CO2 22 - 32 mmol/L 22    ?Calcium 8.9 - 10.3 mg/dL 9.3    ?Total Protein 6.5 - 8.1 g/dL 7.4    ?Total Bilirubin 0.3 - 1.2 mg/dL 0.8    ?Alkaline Phos 38 - 126 U/L 36    ?AST 15 - 41 U/L 19    ?ALT 0 - 44 U/L  14    ? ? ?  Latest Ref Rng & Units 01/08/2022  ? 11:08 AM  ?CBC  ?WBC 4.0 - 10.5 K/uL 7.0    ?Hemoglobin 12.0 - 15.0 g/dL 9.3    ?Hematocrit 36.0 - 46.0 % 30.0    ?Platelets 150 - 400 K/uL 337    ? ? ? ?Assess

## 2022-01-08 NOTE — Progress Notes (Signed)
No concerns, doing good ?

## 2022-01-16 ENCOUNTER — Other Ambulatory Visit: Payer: Self-pay | Admitting: Oncology

## 2022-01-30 ENCOUNTER — Encounter: Payer: Self-pay | Admitting: Nurse Practitioner

## 2022-01-30 ENCOUNTER — Ambulatory Visit (INDEPENDENT_AMBULATORY_CARE_PROVIDER_SITE_OTHER): Payer: Medicare Other | Admitting: Nurse Practitioner

## 2022-01-30 ENCOUNTER — Telehealth: Payer: Self-pay

## 2022-01-30 VITALS — BP 138/70 | HR 84 | Temp 98.5°F | Resp 16 | Ht 67.0 in | Wt 168.0 lb

## 2022-01-30 DIAGNOSIS — Z1211 Encounter for screening for malignant neoplasm of colon: Secondary | ICD-10-CM

## 2022-01-30 DIAGNOSIS — Z800 Family history of malignant neoplasm of digestive organs: Secondary | ICD-10-CM | POA: Diagnosis not present

## 2022-01-30 DIAGNOSIS — Z021 Encounter for pre-employment examination: Secondary | ICD-10-CM

## 2022-01-30 DIAGNOSIS — Z111 Encounter for screening for respiratory tuberculosis: Secondary | ICD-10-CM | POA: Diagnosis not present

## 2022-01-30 DIAGNOSIS — Z1212 Encounter for screening for malignant neoplasm of rectum: Secondary | ICD-10-CM

## 2022-01-30 NOTE — Telephone Encounter (Signed)
Awaiting 01/30/22 office notes for GI referral-Toni ?

## 2022-01-30 NOTE — Progress Notes (Signed)
[facility] [place], [place] [address]  Internal MEDICINE  Office Visit Note  Patient Name: Laurie Gross  [id number]  [id number]  Date of Service: 01/30/2022  Chief Complaint  Patient presents with   Follow-up    employment paperwork (pt to bring), TB, discuss GI referral   Hypertension   Anemia    HPI Florencia Zaccaro for a follow up visit for pre-employment exam, TB test, need GI referral.  Assessment for employment, paperwork filled out TB skin test will read on Thursday.  Niacin discuss in June.  She has hypertension but her blood pressure has been stable.  Current Medication: Outpatient Encounter Medications as of 01/30/2022  Medication Sig   amLODipine (NORVASC) 10 MG tablet TAKE 1 TABLET(10 MG) BY MOUTH DAILY   Cholecalciferol (VITAMIN D-3) 5000 units TABS Take 5,000 Units by mouth daily.   Docusate Sodium (STOOL SOFTENER LAXATIVE PO) Take 1 Dose by mouth as needed.   folic acid (FOLVITE) 1 MG tablet TAKE 1 TABLET BY MOUTH EVERY DAY   lisinopril (ZESTRIL) 40 MG tablet TAKE 1 TABLET BY MOUTH DAILY   Multiple Vitamins-Iron (MULTIVITAMIN/IRON PO) Take 1 capsule by mouth daily.   niacin (NIASPAN) 500 MG CR tablet TAKE 1 TABLET(500 MG) BY MOUTH AT BEDTIME   rosuvastatin (CRESTOR) 5 MG tablet TAKE 1 TABLET(5 MG) BY MOUTH DAILY   Saccharomyces boulardii (FLORASTOR PO) Take 1 capsule by mouth 3 (three) times a week.   triamcinolone cream (KENALOG) 0.1 % Apply 1 application topically 2 (two) times daily. (Patient taking differently: Apply 1 application. topically as needed.)   vitamin B-12 (CYANOCOBALAMIN) 1000 MCG tablet Take 1,000 mcg by mouth daily.   [DISCONTINUED] pantoprazole (PROTONIX) 40 MG tablet Take 1 tablet (40 mg total) by mouth 2 (two) times daily.   No facility-administered encounter medications on file as of 01/30/2022.    Surgical History: Past Surgical History:  Procedure Laterality Date   BREAST BIOPSY Right Age 82   BREAST BIOPSY  Left 07-21-12   Core biopsy showed atypical lobular hyperplasia and a 2 mm foci, atypical ductal hyperplasia and a 1 mm foci. There was no evidence of in situ or invasive cancer   BREAST CYST ASPIRATION  August 2013   inconclusive, with atypical ductal cells identified   COLONOSCOPY  2002, 2005, 2009, 2012   polyps    TOTAL ABDOMINAL HYSTERECTOMY W/ BILATERAL SALPINGOOPHORECTOMY  Age 23    Medical History: Past Medical History:  Diagnosis Date   Anemia    Breast screening, unspecified    Eczema    Family history of colonic polyps    Lump or mass in breast    Neoplasm of uncertain behavior of breast    Personal history of colonic polyps    Special screening for malignant neoplasms, colon    Unspecified essential hypertension     Family History: Family History  Problem Relation Age of Onset   Colon cancer Father    Colon cancer Other        Paternal first cousin   Hypertension Mother    Anemia Mother     Social History   Socioeconomic History   Marital status: Married    Spouse name: Not on file   Number of children: Not on file   Years of education: Not on file   Highest education level: Not on file  Occupational History   Not on file  Tobacco Use   Smoking status: Never   Smokeless tobacco: Never  Vaping Use   Vaping Use: Never used  Substance and Sexual Activity   Alcohol use: Not Currently   Drug use: No   Sexual activity: Not Currently  Other Topics Concern   Not on file  Social History Narrative   Not on file   Social Determinants of Health   Financial Resource Strain: Not on file  Food Insecurity: Not on file  Transportation Needs: Not on file  Physical Activity: Not on file  Stress: Not on file  Social Connections: Not on file  Intimate Partner Violence: Not on file      Review of Systems  Constitutional:  Negative for chills, fatigue and unexpected weight change.  HENT:  Negative for congestion, rhinorrhea, sneezing and sore throat.    Eyes:  Negative for redness.  Respiratory:  Negative for cough, chest tightness and shortness of breath.   Cardiovascular:  Negative for chest pain and palpitations.  Gastrointestinal:  Negative for abdominal pain, constipation, diarrhea, nausea and vomiting.  Genitourinary:  Negative for dysuria and frequency.  Musculoskeletal:  Negative for arthralgias, back pain, joint swelling and neck pain.  Skin:  Negative for rash.  Neurological: Negative.  Negative for tremors and numbness.  Hematological:  Negative for adenopathy. Does not bruise/bleed easily.  Psychiatric/Behavioral:  Negative for behavioral problems (Depression), sleep disturbance and suicidal ideas. The patient is not nervous/anxious.     Vital Signs: BP 138/70   Pulse 84   Temp 98.5 F (36.9 C)   Resp 16   Ht '5\' 7"'$  (1.702 m)   Wt 168 lb (76.2 kg)   SpO2 98%   BMI 26.31 kg/m    Physical Exam Vitals reviewed.  Constitutional:      General: She is not in acute distress.    Appearance: Normal appearance. She is normal weight. She is not ill-appearing.  HENT:     Head: Normocephalic and atraumatic.     Right Ear: Tympanic membrane, ear canal and external ear normal.     Left Ear: Tympanic membrane, ear canal and external ear normal.     Nose: Nose normal.     Mouth/Throat:     Mouth: Mucous membranes are moist.     Pharynx: Oropharynx is clear.  Eyes:     Pupils: Pupils are equal, round, and reactive to light.  Cardiovascular:     Rate and Rhythm: Normal rate and regular rhythm.     Heart sounds: Normal heart sounds. No murmur heard. Pulmonary:     Effort: Pulmonary effort is normal. No respiratory distress.     Breath sounds: Normal breath sounds.  Neurological:     Mental Status: She is alert and oriented to person, place, and time.  Psychiatric:        Mood and Affect: Mood normal.        Behavior: Behavior normal.        Assessment/Plan: 1. Encounter for pre-employment health screening  examination Exam for pre-employment done, paperwork completed.   2. Screening for colorectal cancer Referred to GI - Ambulatory referral to Gastroenterology  3. Family history of colon cancer Referred to GI - Ambulatory referral to Gastroenterology  4. Encounter for PPD test TB skin test done for pre-employment, return in 2 days to have the test read. - TB Skin Test   General Counseling: kiamesha samet understanding of the findings of todays visit and agrees with plan of treatment. I have discussed any further diagnostic evaluation that may be needed or ordered today. We also reviewed  her medications today. she has been encouraged to call the office with any questions or concerns that should arise related to todays visit.    Orders Placed This Encounter  Procedures   Ambulatory referral to Gastroenterology   TB Skin Test    No orders of the defined types were placed in this encounter.   Return in about 3 months (around 05/02/2022) for F/U, Quinntin Malter PCP.   Total time spent:30 Minutes Time spent includes review of chart, medications, test results, and follow up plan with the patient.   [place] Controlled Substance Database was reviewed by me.  This patient was seen by Jonetta Osgood, FNP-C in collaboration with Dr. Clayborn Bigness as a part of collaborative care agreement.   Takyah Ciaramitaro R. Valetta Fuller, MSN, FNP-C Internal medicine

## 2022-02-01 LAB — TB SKIN TEST: TB Skin Test: NEGATIVE

## 2022-02-06 NOTE — Telephone Encounter (Signed)
Referral sent via [facility] to Dr. Olean Ree w/ [facility] ?

## 2022-02-15 ENCOUNTER — Other Ambulatory Visit: Payer: Self-pay | Admitting: Nurse Practitioner

## 2022-02-15 DIAGNOSIS — K2100 Gastro-esophageal reflux disease with esophagitis, without bleeding: Secondary | ICD-10-CM

## 2022-02-21 NOTE — Telephone Encounter (Signed)
Appointment with Dr. [name] 05/22/22 @ 10:30-Toni

## 2022-03-05 DIAGNOSIS — Z1231 Encounter for screening mammogram for malignant neoplasm of breast: Secondary | ICD-10-CM | POA: Diagnosis not present

## 2022-03-16 ENCOUNTER — Other Ambulatory Visit: Payer: Self-pay | Admitting: Nurse Practitioner

## 2022-03-16 DIAGNOSIS — E782 Mixed hyperlipidemia: Secondary | ICD-10-CM | POA: Diagnosis not present

## 2022-03-16 DIAGNOSIS — R7301 Impaired fasting glucose: Secondary | ICD-10-CM | POA: Diagnosis not present

## 2022-03-16 DIAGNOSIS — E559 Vitamin D deficiency, unspecified: Secondary | ICD-10-CM | POA: Diagnosis not present

## 2022-03-16 DIAGNOSIS — E039 Hypothyroidism, unspecified: Secondary | ICD-10-CM | POA: Diagnosis not present

## 2022-03-17 ENCOUNTER — Encounter: Payer: Self-pay | Admitting: Nurse Practitioner

## 2022-03-17 LAB — LIPID PANEL
Chol/HDL Ratio: 3.3 ratio (ref 0.0–4.4)
Cholesterol, Total: 174 mg/dL (ref 100–199)
HDL: 53 mg/dL (ref >39)
LDL Chol Calc (NIH): 63 mg/dL (ref 0–99)
Triglycerides: 374 mg/dL — ABNORMAL HIGH (ref 0–149)
VLDL Cholesterol Cal: 58 mg/dL — ABNORMAL HIGH (ref 5–40)

## 2022-03-17 LAB — COMPREHENSIVE METABOLIC PANEL
ALT: 12 IU/L (ref 0–32)
AST: 16 IU/L (ref 0–40)
Albumin/Globulin Ratio: 1.7 (ref 1.2–2.2)
Albumin: 4.5 g/dL (ref 3.8–4.8)
Alkaline Phosphatase: 53 IU/L (ref 44–121)
BUN/Creatinine Ratio: 22 (ref 12–28)
BUN: 17 mg/dL (ref 8–27)
Bilirubin Total: 0.3 mg/dL (ref 0.0–1.2)
CO2: 23 mmol/L (ref 20–29)
Calcium: 9.8 mg/dL (ref 8.7–10.3)
Chloride: 104 mmol/L (ref 96–106)
Creatinine, Ser: 0.76 mg/dL (ref 0.57–1.00)
Globulin, Total: 2.6 g/dL (ref 1.5–4.5)
Glucose: 89 mg/dL (ref 70–99)
Potassium: 4 mmol/L (ref 3.5–5.2)
Sodium: 142 mmol/L (ref 134–144)
Total Protein: 7.1 g/dL (ref 6.0–8.5)
eGFR: 87 mL/min/{1.73_m2} (ref >59)

## 2022-03-17 LAB — HGB A1C W/O EAG: Hgb A1c MFr Bld: 4.8 % (ref 4.8–5.6)

## 2022-03-17 LAB — TSH: TSH: 1.98 u[IU]/mL (ref 0.450–4.500)

## 2022-03-17 LAB — T4, FREE: Free T4: 1.13 ng/dL (ref 0.82–1.77)

## 2022-03-17 LAB — VITAMIN D 25 HYDROXY (VIT D DEFICIENCY, FRACTURES): Vit D, 25-Hydroxy: 53 ng/mL (ref 30.0–100.0)

## 2022-03-20 ENCOUNTER — Encounter: Payer: Self-pay | Admitting: Nurse Practitioner

## 2022-03-20 ENCOUNTER — Ambulatory Visit (INDEPENDENT_AMBULATORY_CARE_PROVIDER_SITE_OTHER): Payer: Medicare Other | Admitting: Nurse Practitioner

## 2022-03-20 VITALS — BP 135/68 | HR 70 | Temp 98.5°F | Resp 16 | Ht 68.0 in | Wt 167.0 lb

## 2022-03-20 DIAGNOSIS — I10 Essential (primary) hypertension: Secondary | ICD-10-CM

## 2022-03-20 DIAGNOSIS — Z800 Family history of malignant neoplasm of digestive organs: Secondary | ICD-10-CM | POA: Diagnosis not present

## 2022-03-20 DIAGNOSIS — Z780 Asymptomatic menopausal state: Secondary | ICD-10-CM

## 2022-03-20 DIAGNOSIS — Z0001 Encounter for general adult medical examination with abnormal findings: Secondary | ICD-10-CM | POA: Diagnosis not present

## 2022-03-20 DIAGNOSIS — R300 Dysuria: Secondary | ICD-10-CM | POA: Diagnosis not present

## 2022-03-20 DIAGNOSIS — E781 Pure hyperglyceridemia: Secondary | ICD-10-CM | POA: Diagnosis not present

## 2022-03-20 MED ORDER — ICOSAPENT ETHYL 1 G PO CAPS: 2.0000 g | ORAL_CAPSULE | Freq: Two times a day (BID) | ORAL | 3 refills | Status: AC

## 2022-03-20 MED ORDER — AMLODIPINE BESYLATE 10 MG PO TABS: 10.0000 mg | ORAL_TABLET | Freq: Every day | ORAL | 1 refills | Status: DC

## 2022-03-20 MED ORDER — LISINOPRIL 40 MG PO TABS: 40.0000 mg | ORAL_TABLET | Freq: Every day | ORAL | 1 refills | Status: AC

## 2022-03-20 NOTE — Progress Notes (Cosign Needed)
[facility] [place], [place] [address]  Internal MEDICINE  Office Visit Note  Patient Name: Laurie Gross  [id number]  [id number]  Date of Service: 03/20/2022  Chief Complaint  Patient presents with   Annual Exam   Results    labs   Hypertension   Anemia    HPI Kyllie presents for an annual well visit and physical exam.  She is a well-appearing 66 year old female with hypertension, hyperlipidemia, GERD, cervical radiculopathy, vitamin D deficiency, and history of anemia.  She has a family history of colon cancer and has had colonic polyps on prior colonoscopy.  She is due for her annual mammogram this month and had her mammogram done last week. She is also due for BMD screening for osteoporosis.  She also had her last colonoscopy in 2012 so she is overdue for routine screening colonoscopy.  She is scheduled to see her gastroenterologist next month.  She is a non-smoker and denies any alcohol or recreational drug use. Reviewed additional labs with patient: Her cholesterol panel has significantly improved but her triglyceride level is significantly elevated at 374 and her VLDL remains elevated at 58, she is currently taking rosuvastatin 5 mg daily and niacin 500 mg daily.  She is not taking any over-the-counter supplemental fish oil.  Her ASCVD risk score is 12.4% as seen below.    The 10-year ASCVD risk score (Arnett DK, et al., 2019) is: 12.4%   Values used to calculate the score:     Age: 24 years     Sex: Female     Is Non-Hispanic African American: Yes     Diabetic: No     Tobacco smoker: No     Systolic Blood Pressure: 295 mmHg     Is BP treated: Yes     HDL Cholesterol: 71 mg/dL     Total Cholesterol: 262 mg/dL    Current Medication: Outpatient Encounter Medications as of 03/20/2022  Medication Sig   Cholecalciferol (VITAMIN D-3) 5000 units TABS Take 5,000 Units by mouth daily.   Docusate Sodium (STOOL SOFTENER LAXATIVE PO) Take 1 Dose by mouth  as needed.   folic acid (FOLVITE) 1 MG tablet TAKE 1 TABLET BY MOUTH EVERY DAY   icosapent Ethyl (VASCEPA) 1 g capsule Take 2 capsules (2 g total) by mouth 2 (two) times daily.   Multiple Vitamins-Iron (MULTIVITAMIN/IRON PO) Take 1 capsule by mouth daily.   niacin (NIASPAN) 500 MG CR tablet TAKE 1 TABLET(500 MG) BY MOUTH AT BEDTIME   pantoprazole (PROTONIX) 40 MG tablet TAKE 1 TABLET(40 MG) BY MOUTH TWICE DAILY   rosuvastatin (CRESTOR) 5 MG tablet TAKE 1 TABLET(5 MG) BY MOUTH DAILY   Saccharomyces boulardii (FLORASTOR PO) Take 1 capsule by mouth 3 (three) times a week.   triamcinolone cream (KENALOG) 0.1 % Apply 1 application topically 2 (two) times daily. (Patient taking differently: Apply 1 application  topically as needed.)   vitamin B-12 (CYANOCOBALAMIN) 1000 MCG tablet Take 1,000 mcg by mouth daily.   [DISCONTINUED] amLODipine (NORVASC) 10 MG tablet TAKE 1 TABLET(10 MG) BY MOUTH DAILY   [DISCONTINUED] lisinopril (ZESTRIL) 40 MG tablet TAKE 1 TABLET BY MOUTH DAILY   amLODipine (NORVASC) 10 MG tablet Take 1 tablet (10 mg total) by mouth daily.   lisinopril (ZESTRIL) 40 MG tablet Take 1 tablet (40 mg total) by mouth daily.   No facility-administered encounter medications on file as of 03/20/2022.    Surgical History: Past Surgical History:  Procedure Laterality Date  BREAST BIOPSY Right Age 62   BREAST BIOPSY Left 07-21-12   Core biopsy showed atypical lobular hyperplasia and a 2 mm foci, atypical ductal hyperplasia and a 1 mm foci. There was no evidence of in situ or invasive cancer   BREAST CYST ASPIRATION  August 2013   inconclusive, with atypical ductal cells identified   COLONOSCOPY  2002, 2005, 2009, 2012   polyps    TOTAL ABDOMINAL HYSTERECTOMY W/ BILATERAL SALPINGOOPHORECTOMY  Age 61    Medical History: Past Medical History:  Diagnosis Date   Anemia    Breast screening, unspecified    Eczema    Family history of colonic polyps    Lump or mass in breast    Neoplasm of  uncertain behavior of breast    Personal history of colonic polyps    Special screening for malignant neoplasms, colon    Unspecified essential hypertension     Family History: Family History  Problem Relation Age of Onset   Colon cancer Father    Colon cancer Other        Paternal first cousin   Hypertension Mother    Anemia Mother     Social History   Socioeconomic History   Marital status: Married    Spouse name: Not on file   Number of children: Not on file   Years of education: Not on file   Highest education level: Not on file  Occupational History   Not on file  Tobacco Use   Smoking status: Never   Smokeless tobacco: Never  Vaping Use   Vaping Use: Never used  Substance and Sexual Activity   Alcohol use: Not Currently   Drug use: No   Sexual activity: Not Currently  Other Topics Concern   Not on file  Social History Narrative   Not on file   Social Determinants of Health   Financial Resource Strain: Not on file  Food Insecurity: Not on file  Transportation Needs: Not on file  Physical Activity: Not on file  Stress: Not on file  Social Connections: Not on file  Intimate Partner Violence: Not on file      Review of Systems  Constitutional:  Negative for activity change, appetite change, chills, fatigue, fever and unexpected weight change.  HENT: Negative.  Negative for congestion, ear pain, rhinorrhea, sore throat and trouble swallowing.   Eyes: Negative.   Respiratory: Negative.  Negative for cough, chest tightness, shortness of breath and wheezing.   Cardiovascular: Negative.  Negative for chest pain.  Gastrointestinal: Negative.  Negative for abdominal pain, blood in stool, constipation, diarrhea, nausea and vomiting.  Endocrine: Negative.   Genitourinary: Negative.  Negative for difficulty urinating, dysuria, frequency, hematuria and urgency.  Musculoskeletal: Negative.  Negative for arthralgias, back pain, joint swelling, myalgias and neck  pain.  Skin: Negative.  Negative for rash and wound.  Allergic/Immunologic: Negative.  Negative for immunocompromised state.  Neurological: Negative.  Negative for dizziness, seizures, numbness and headaches.  Hematological: Negative.   Psychiatric/Behavioral: Negative.  Negative for behavioral problems, self-injury and suicidal ideas. The patient is not nervous/anxious.     Vital Signs: BP 135/68   Pulse 70   Temp 98.5 F (36.9 C)   Resp 16   Ht '5\' 8"'$  (1.727 m)   Wt 167 lb (75.8 kg)   SpO2 99%   BMI 25.39 kg/m    Physical Exam Vitals reviewed.  Constitutional:      General: She is awake. She is not in acute distress.  Appearance: Normal appearance. She is well-developed and well-groomed. She is obese. She is not ill-appearing or diaphoretic.  HENT:     Head: Normocephalic and atraumatic.     Right Ear: Tympanic membrane, ear canal and external ear normal.     Left Ear: Tympanic membrane, ear canal and external ear normal.     Nose: Nose normal. No congestion or rhinorrhea.     Mouth/Throat:     Lips: Pink.     Mouth: Mucous membranes are moist.     Pharynx: Oropharynx is clear. Uvula midline. No oropharyngeal exudate or posterior oropharyngeal erythema.  Eyes:     General: Lids are normal. Vision grossly intact. Gaze aligned appropriately. No scleral icterus.       Right eye: No discharge.        Left eye: No discharge.     Extraocular Movements: Extraocular movements intact.     Conjunctiva/sclera: Conjunctivae normal.     Pupils: Pupils are equal, round, and reactive to light.     Funduscopic exam:    Right eye: Red reflex present.        Left eye: Red reflex present. Neck:     Thyroid: No thyromegaly.     Vascular: No JVD.     Trachea: Trachea and phonation normal. No tracheal deviation.  Cardiovascular:     Rate and Rhythm: Normal rate and regular rhythm.     Pulses: Normal pulses.     Heart sounds: Normal heart sounds, S1 normal and S2 normal. No murmur  heard.    No friction rub. No gallop.  Pulmonary:     Effort: Pulmonary effort is normal. No accessory muscle usage or respiratory distress.     Breath sounds: Normal breath sounds and air entry. No stridor. No wheezing or rales.  Chest:     Chest wall: No tenderness.     Comments: Declined clinical breast exam, had mammogram done last week Abdominal:     General: Bowel sounds are normal. There is no distension.     Palpations: Abdomen is soft. There is no shifting dullness, fluid wave, mass or pulsatile mass.     Tenderness: There is no abdominal tenderness. There is no guarding or rebound.  Musculoskeletal:        General: No tenderness or deformity. Normal range of motion.     Cervical back: Normal range of motion and neck supple.  Lymphadenopathy:     Cervical: No cervical adenopathy.  Skin:    General: Skin is warm and dry.     Capillary Refill: Capillary refill takes less than 2 seconds.     Coloration: Skin is not pale.     Findings: No erythema or rash.  Neurological:     Mental Status: She is alert and oriented to person, place, and time.     Cranial Nerves: No cranial nerve deficit.     Motor: No abnormal muscle tone.     Coordination: Coordination normal.     Gait: Gait normal.     Deep Tendon Reflexes: Reflexes are normal and symmetric.  Psychiatric:        Mood and Affect: Mood normal.        Behavior: Behavior normal. Behavior is cooperative.        Thought Content: Thought content normal.        Judgment: Judgment normal.       Assessment/Plan: 1. Encounter for general adult medical examination with abnormal findings Age-appropriate preventive screenings and vaccinations discussed, annual  physical exam completed. Routine labs for health maintenance drawn prior to office visit and results discussed with patient today.  BMD screening due, routine mammogram done last week and patient has an appointment with her gastroenterologist next month.  PHM updated.   2.  Family history of colon cancer Scheduled with her gastroenterologist next month  3. Hypertriglyceridemia Will start prescription icosapent and repeat cholesterol panel in 6 months - icosapent Ethyl (VASCEPA) 1 g capsule; Take 2 capsules (2 g total) by mouth 2 (two) times daily.  Dispense: 120 capsule; Refill: 3 - Lipid Profile  4. Essential hypertension, benign Blood pressure is well controlled with current medications, refills ordered. - lisinopril (ZESTRIL) 40 MG tablet; Take 1 tablet (40 mg total) by mouth daily.  Dispense: 90 tablet; Refill: 1 - amLODipine (NORVASC) 10 MG tablet; Take 1 tablet (10 mg total) by mouth daily.  Dispense: 90 tablet; Refill: 1  5. Asymptomatic menopausal state BMD screening due, bone density scan ordered to be done at [facility] per patient request - DG Bone Density; Future  6. Dysuria Routine urinalysis done - UA/M w/rflx Culture, Routine      General Counseling: Anadia verbalizes understanding of the findings of todays visit and agrees with plan of treatment. I have discussed any further diagnostic evaluation that may be needed or ordered today. We also reviewed her medications today. she has been encouraged to call the office with any questions or concerns that should arise related to todays visit.    Orders Placed This Encounter  Procedures   DG Bone Density   UA/M w/rflx Culture, Routine   Lipid Profile    Meds ordered this encounter  Medications   icosapent Ethyl (VASCEPA) 1 g capsule    Sig: Take 2 capsules (2 g total) by mouth 2 (two) times daily.    Dispense:  120 capsule    Refill:  3   lisinopril (ZESTRIL) 40 MG tablet    Sig: Take 1 tablet (40 mg total) by mouth daily.    Dispense:  90 tablet    Refill:  1   amLODipine (NORVASC) 10 MG tablet    Sig: Take 1 tablet (10 mg total) by mouth daily.    Dispense:  90 tablet    Refill:  1    Return in about 6 months (around 09/19/2022) for F/U, Labs, Anon Raices PCP.   Total time  spent:30 Minutes Time spent includes review of chart, medications, test results, and follow up plan with the patient.   [place] Controlled Substance Database was reviewed by me.  This patient was seen by Jonetta Osgood, FNP-C in collaboration with Dr. Clayborn Bigness as a part of collaborative care agreement.  Teneisha Gignac R. Valetta Fuller, MSN, FNP-C Internal medicine

## 2022-03-21 LAB — MICROSCOPIC EXAMINATION
Bacteria, UA: NONE SEEN
Casts: NONE SEEN /lpf
Epithelial Cells (non renal): NONE SEEN /hpf (ref 0–10)
RBC, Urine: NONE SEEN /hpf (ref 0–2)
WBC, UA: NONE SEEN /hpf (ref 0–5)

## 2022-03-21 LAB — UA/M W/RFLX CULTURE, ROUTINE
Bilirubin, UA: NEGATIVE
Glucose, UA: NEGATIVE
Ketones, UA: NEGATIVE
Leukocytes,UA: NEGATIVE
Nitrite, UA: NEGATIVE
Protein,UA: NEGATIVE
RBC, UA: NEGATIVE
Specific Gravity, UA: 1.005 (ref 1.005–1.030)
Urobilinogen, Ur: 0.2 mg/dL (ref 0.2–1.0)
pH, UA: 5.5 (ref 5.0–7.5)

## 2022-04-26 ENCOUNTER — Telehealth: Payer: Self-pay

## 2022-04-26 ENCOUNTER — Ambulatory Visit (INDEPENDENT_AMBULATORY_CARE_PROVIDER_SITE_OTHER): Payer: Medicare Other | Admitting: Physician Assistant

## 2022-04-26 VITALS — BP 120/70 | HR 84 | Temp 98.6°F | Resp 16 | Ht 68.0 in | Wt 167.0 lb

## 2022-04-26 DIAGNOSIS — J029 Acute pharyngitis, unspecified: Secondary | ICD-10-CM | POA: Diagnosis not present

## 2022-04-26 DIAGNOSIS — R21 Rash and other nonspecific skin eruption: Secondary | ICD-10-CM

## 2022-04-26 LAB — POCT RAPID STREP A (OFFICE): Rapid Strep A Screen: NEGATIVE

## 2022-04-26 NOTE — Telephone Encounter (Signed)
Try to call pt voice mail full regarding she left message that she is fatigue,sore throat

## 2022-04-26 NOTE — Telephone Encounter (Signed)
Pt LMOM requesting an appt for sore throat, that started on Sat 04/21/22, cough, and a rash on face started on wed 04/25/22.  Advised pt to do a covid test and  call us back with results and we will make decision if she needs appt

## 2022-04-26 NOTE — Progress Notes (Signed)
[facility] [place], [place] [address]  Internal MEDICINE  Office Visit Note  Patient Name: Laurie Gross  [id number]  [id number]  Date of Service: 05/06/2022  Chief Complaint  Patient presents with   Sore Throat   Rash    Pt did change laundry detergent   Cough    On and off    Sinusitis    COVID test was negative; completing Strep test     HPI Pt is here for a sick visit. -Started 3 days ago with sore throat, and rash on face. Improving now -No pain or itching -No difficulty swallowing, no congestion -Covid negative -She did change detergent recently and may be a reaction from this, but wants to make sure it isnt strep  Current Medication:  Outpatient Encounter Medications as of 04/26/2022  Medication Sig   amLODipine (NORVASC) 10 MG tablet Take 1 tablet (10 mg total) by mouth daily.   Cholecalciferol (VITAMIN D-3) 5000 units TABS Take 5,000 Units by mouth daily.   Docusate Sodium (STOOL SOFTENER LAXATIVE PO) Take 1 Dose by mouth as needed.   folic acid (FOLVITE) 1 MG tablet TAKE 1 TABLET BY MOUTH EVERY DAY   icosapent Ethyl (VASCEPA) 1 g capsule Take 2 capsules (2 g total) by mouth 2 (two) times daily.   lisinopril (ZESTRIL) 40 MG tablet Take 1 tablet (40 mg total) by mouth daily.   Multiple Vitamins-Iron (MULTIVITAMIN/IRON PO) Take 1 capsule by mouth daily.   niacin (NIASPAN) 500 MG CR tablet TAKE 1 TABLET(500 MG) BY MOUTH AT BEDTIME   pantoprazole (PROTONIX) 40 MG tablet TAKE 1 TABLET(40 MG) BY MOUTH TWICE DAILY   rosuvastatin (CRESTOR) 5 MG tablet TAKE 1 TABLET(5 MG) BY MOUTH DAILY   Saccharomyces boulardii (FLORASTOR PO) Take 1 capsule by mouth 3 (three) times a week.   triamcinolone cream (KENALOG) 0.1 % Apply 1 application topically 2 (two) times daily. (Patient taking differently: Apply 1 application  topically as needed.)   vitamin B-12 (CYANOCOBALAMIN) 1000 MCG tablet Take 1,000 mcg by mouth daily.   No facility-administered  encounter medications on file as of 04/26/2022.      Medical History: Past Medical History:  Diagnosis Date   Anemia    Breast screening, unspecified    Eczema    Family history of colonic polyps    Lump or mass in breast    Neoplasm of uncertain behavior of breast    Personal history of colonic polyps    Special screening for malignant neoplasms, colon    Unspecified essential hypertension      Vital Signs: BP 120/70   Pulse 84   Temp 98.6 F (37 C)   Resp 16   Ht '5\' 8"'$  (1.727 m)   Wt 167 lb (75.8 kg)   SpO2 99%   BMI 25.39 kg/m    Review of Systems  Constitutional:  Negative for fatigue and fever.  HENT:  Positive for postnasal drip and sore throat. Negative for congestion and mouth sores.   Respiratory:  Negative for cough and wheezing.   Cardiovascular:  Negative for chest pain.  Genitourinary:  Negative for flank pain.  Skin:  Positive for rash.  Psychiatric/Behavioral: Negative.      Physical Exam Vitals and nursing note reviewed.  Constitutional:      General: She is not in acute distress.    Appearance: Normal appearance. She is normal weight. She is not ill-appearing.  HENT:     Head: Normocephalic and atraumatic.  Nose: Nose normal.     Mouth/Throat:     Mouth: Mucous membranes are moist.     Pharynx: Oropharynx is clear. Posterior oropharyngeal erythema present.  Eyes:     Pupils: Pupils are equal, round, and reactive to light.  Cardiovascular:     Rate and Rhythm: Normal rate and regular rhythm.     Heart sounds: Normal heart sounds. No murmur heard. Pulmonary:     Effort: Pulmonary effort is normal. No respiratory distress.     Breath sounds: Normal breath sounds.  Musculoskeletal:     Cervical back: Normal range of motion.  Skin:    Findings: Rash present.     Comments: Mild, resolving rash along face  Neurological:     Mental Status: She is alert and oriented to person, place, and time.  Psychiatric:        Mood and Affect: Mood  normal.        Behavior: Behavior normal.       Assessment/Plan: 1. Sore throat - POCT rapid strep A is negative.  Improving, may try warm tea with honey, throat lozenges, and salt water gargles to help with soothing throat.  2. Rash of face Greatly improved and appears to be resolving on its own.  Continue to monitor symptoms and call if any worsening   General Counseling: Trinisha verbalizes understanding of the findings of todays visit and agrees with plan of treatment. I have discussed any further diagnostic evaluation that may be needed or ordered today. We also reviewed her medications today. she has been encouraged to call the office with any questions or concerns that should arise related to todays visit.    Counseling:    Orders Placed This Encounter  Procedures   POCT rapid strep A    No orders of the defined types were placed in this encounter.   Time spent:25 Minutes

## 2022-05-07 ENCOUNTER — Other Ambulatory Visit: Payer: Self-pay

## 2022-06-29 DIAGNOSIS — Z86010 Personal history of colonic polyps: Secondary | ICD-10-CM | POA: Diagnosis not present

## 2022-06-29 DIAGNOSIS — K5909 Other constipation: Secondary | ICD-10-CM | POA: Diagnosis not present

## 2022-06-29 DIAGNOSIS — K5730 Diverticulosis of large intestine without perforation or abscess without bleeding: Secondary | ICD-10-CM | POA: Diagnosis not present

## 2022-06-29 DIAGNOSIS — Z800 Family history of malignant neoplasm of digestive organs: Secondary | ICD-10-CM | POA: Diagnosis not present

## 2022-07-09 DIAGNOSIS — M2012 Hallux valgus (acquired), left foot: Secondary | ICD-10-CM | POA: Diagnosis not present

## 2022-07-09 DIAGNOSIS — M2011 Hallux valgus (acquired), right foot: Secondary | ICD-10-CM | POA: Diagnosis not present

## 2022-07-10 ENCOUNTER — Inpatient Hospital Stay: Payer: Medicare Other | Admitting: Oncology

## 2022-07-10 ENCOUNTER — Inpatient Hospital Stay: Payer: Medicare Other

## 2022-07-23 ENCOUNTER — Telehealth (INDEPENDENT_AMBULATORY_CARE_PROVIDER_SITE_OTHER): Payer: Medicare Other | Admitting: Physician Assistant

## 2022-07-23 VITALS — Resp 16 | Ht 68.0 in

## 2022-07-23 DIAGNOSIS — U071 COVID-19: Secondary | ICD-10-CM | POA: Diagnosis not present

## 2022-07-23 MED ORDER — NIRMATRELVIR/RITONAVIR (PAXLOVID)TABLET: 3.0000 | ORAL_TABLET | Freq: Two times a day (BID) | ORAL | 0 refills | Status: AC

## 2022-07-23 NOTE — Progress Notes (Signed)
[facility] [place], [place] [address]  Internal MEDICINE  Telephone Visit  Patient Name: Laurie Gross  [id number]  [id number]  Date of Service: 08/05/2022  I connected with the patient at 12:10 by telephone and verified the patients identity using two identifiers.   I discussed the limitations, risks, security and privacy concerns of performing an evaluation and management service by telephone and the availability of in person appointments. I also discussed with the patient that there may be a patient responsible charge related to the service.  The patient expressed understanding and agrees to proceed.    Chief Complaint  Patient presents with   Telephone Assessment    [phone]   Telephone Screen   Covid Positive    Tested positive this morning for Covid.   Generalized Body Aches   Nasal Congestion   Sore Throat    HPI Pt is here for virtual sick visit -Tested positive for covid -Symptoms started Thursday with cold chills and body aches. Yesterday coughing, sore throat this morning. Having congestion. Has been getting a little worse.  -Denies SOB or wheezing. -Has not taken any mucinex or nasal sprays  Current Medication: Outpatient Encounter Medications as of 07/23/2022  Medication Sig   amLODipine (NORVASC) 10 MG tablet Take 1 tablet (10 mg total) by mouth daily.   Cholecalciferol (VITAMIN D-3) 5000 units TABS Take 5,000 Units by mouth daily.   Docusate Sodium (STOOL SOFTENER LAXATIVE PO) Take 1 Dose by mouth as needed.   folic acid (FOLVITE) 1 MG tablet TAKE 1 TABLET BY MOUTH EVERY DAY   icosapent Ethyl (VASCEPA) 1 g capsule Take 2 capsules (2 g total) by mouth 2 (two) times daily.   lisinopril (ZESTRIL) 40 MG tablet Take 1 tablet (40 mg total) by mouth daily.   Multiple Vitamins-Iron (MULTIVITAMIN/IRON PO) Take 1 capsule by mouth daily.   niacin (NIASPAN) 500 MG CR tablet TAKE 1 TABLET(500 MG) BY MOUTH AT BEDTIME   [EXPIRED]  nirmatrelvir/ritonavir EUA (PAXLOVID) 20 x 150 MG & 10 x '100MG'$  TABS Take 3 tablets by mouth 2 (two) times daily for 5 days. (Take nirmatrelvir 150 mg two tablets twice daily for 5 days and ritonavir 100 mg one tablet twice daily for 5 days) Patient GFR is 87   pantoprazole (PROTONIX) 40 MG tablet TAKE 1 TABLET(40 MG) BY MOUTH TWICE DAILY   rosuvastatin (CRESTOR) 5 MG tablet TAKE 1 TABLET(5 MG) BY MOUTH DAILY   Saccharomyces boulardii (FLORASTOR PO) Take 1 capsule by mouth 3 (three) times a week.   triamcinolone cream (KENALOG) 0.1 % Apply 1 application topically 2 (two) times daily. (Patient taking differently: Apply 1 application  topically as needed.)   vitamin B-12 (CYANOCOBALAMIN) 1000 MCG tablet Take 1,000 mcg by mouth daily.   No facility-administered encounter medications on file as of 07/23/2022.    Surgical History: Past Surgical History:  Procedure Laterality Date   BREAST BIOPSY Right Age 56   BREAST BIOPSY Left 07-21-12   Core biopsy showed atypical lobular hyperplasia and a 2 mm foci, atypical ductal hyperplasia and a 1 mm foci. There was no evidence of in situ or invasive cancer   BREAST CYST ASPIRATION  August 2013   inconclusive, with atypical ductal cells identified   COLONOSCOPY  2002, 2005, 2009, 2012   polyps    TOTAL ABDOMINAL HYSTERECTOMY W/ BILATERAL SALPINGOOPHORECTOMY  Age 59    Medical History: Past Medical History:  Diagnosis Date   Anemia    Breast screening,  unspecified    Eczema    Family history of colonic polyps    Lump or mass in breast    Neoplasm of uncertain behavior of breast    Personal history of colonic polyps    Special screening for malignant neoplasms, colon    Unspecified essential hypertension     Family History: Family History  Problem Relation Age of Onset   Colon cancer Father    Colon cancer Other        Paternal first cousin   Hypertension Mother    Anemia Mother     Social History   Socioeconomic History   Marital  status: Married    Spouse name: Not on file   Number of children: Not on file   Years of education: Not on file   Highest education level: Not on file  Occupational History   Not on file  Tobacco Use   Smoking status: Never   Smokeless tobacco: Never  Vaping Use   Vaping Use: Never used  Substance and Sexual Activity   Alcohol use: Not Currently   Drug use: No   Sexual activity: Not Currently  Other Topics Concern   Not on file  Social History Narrative   Not on file   Social Determinants of Health   Financial Resource Strain: Not on file  Food Insecurity: Not on file  Transportation Needs: Not on file  Physical Activity: Not on file  Stress: Not on file  Social Connections: Not on file  Intimate Partner Violence: Not on file      Review of Systems  Constitutional:  Positive for chills. Negative for fatigue and fever.  HENT:  Positive for congestion, postnasal drip and sore throat. Negative for mouth sores.   Respiratory:  Positive for cough. Negative for shortness of breath and wheezing.   Cardiovascular:  Negative for chest pain.  Genitourinary:  Negative for flank pain.  Musculoskeletal:  Positive for myalgias.  Psychiatric/Behavioral: Negative.      Vital Signs: Resp 16   Ht '5\' 8"'$  (1.727 m)   BMI 25.39 kg/m    Observation/Objective:   Pt is able to carry out conversation  Assessment/Plan: 1. Acute COVID-19 Will start paxlovid and advised to rest and stay well hydrated. May use warm tea with honey and lozenges to soothe throat. - nirmatrelvir/ritonavir EUA (PAXLOVID) 20 x 150 MG & 10 x '100MG'$  TABS; Take 3 tablets by mouth 2 (two) times daily for 5 days. (Take nirmatrelvir 150 mg two tablets twice daily for 5 days and ritonavir 100 mg one tablet twice daily for 5 days) Patient GFR is 87  Dispense: 30 tablet; Refill: 0     General Counseling: Laurie Gross verbalizes understanding of the findings of today's phone visit and agrees with plan of treatment. I have  discussed any further diagnostic evaluation that may be needed or ordered today. We also reviewed her medications today. she has been encouraged to call the office with any questions or concerns that should arise related to todays visit.    No orders of the defined types were placed in this encounter.   Meds ordered this encounter  Medications   nirmatrelvir/ritonavir EUA (PAXLOVID) 20 x 150 MG & 10 x '100MG'$  TABS    Sig: Take 3 tablets by mouth 2 (two) times daily for 5 days. (Take nirmatrelvir 150 mg two tablets twice daily for 5 days and ritonavir 100 mg one tablet twice daily for 5 days) Patient GFR is 87    Dispense:  30 tablet    Refill:  0    Time spent:35 Minutes    Dr Lavera Guise Internal medicine

## 2022-08-14 DIAGNOSIS — H2513 Age-related nuclear cataract, bilateral: Secondary | ICD-10-CM | POA: Diagnosis not present

## 2022-08-14 DIAGNOSIS — H52223 Regular astigmatism, bilateral: Secondary | ICD-10-CM | POA: Diagnosis not present

## 2022-08-14 DIAGNOSIS — H5203 Hypermetropia, bilateral: Secondary | ICD-10-CM | POA: Diagnosis not present

## 2022-08-14 DIAGNOSIS — H524 Presbyopia: Secondary | ICD-10-CM | POA: Diagnosis not present

## 2022-09-05 ENCOUNTER — Ambulatory Visit (LOCAL_COMMUNITY_HEALTH_CENTER): Payer: Medicare Other

## 2022-09-05 DIAGNOSIS — Z23 Encounter for immunization: Secondary | ICD-10-CM | POA: Diagnosis not present

## 2022-09-05 DIAGNOSIS — Z719 Counseling, unspecified: Secondary | ICD-10-CM

## 2022-09-05 NOTE — Progress Notes (Signed)
  Are you feeling sick today? No   Have you ever received a dose of COVID-19 Vaccine? [employer], [place], [place], [place], Other) Yes  If yes, which vaccine and how many doses?   PFIZER, 5   Did you bring the vaccination record card or other documentation?  No   Do you have a health condition or are undergoing treatment that makes you moderately or severely immunocompromised? This would include, but not be limited to: cancer, HIV, organ transplant, immunosuppressive therapy/high-dose corticosteroids, or moderate/severe primary immunodeficiency.  No  Have you received COVID-19 vaccine before or during hematopoietic cell transplant (HCT) or CAR-T-cell therapies? No  Have you ever had an allergic reaction to: (This would include a severe allergic reaction or a reaction that caused hives, swelling, or respiratory distress, including wheezing.) A component of a COVID-19 vaccine or a previous dose of COVID-19 vaccine? No   Have you ever had an allergic reaction to another vaccine (other thanCOVID-19 vaccine) or an injectable medication? (This would include a severe allergic reaction or a reaction that caused hives, swelling, or respiratory distress, including wheezing.)   No    Do you have a history of any of the following:  Myocarditis or Pericarditis No  Dermal fillers:  No  Multisystem Inflammatory Syndrome (MIS-C or MIS-A)? No  COVID-19 disease within the past 3 months? No  Vaccinated with monkeypox vaccine in the last 4 weeks? No  Eligible, administered [employer] [phone]. Monitored and tolerated well. Provided VIS and NCIR copy. M.Tiarna Koppen, LPN.

## 2022-09-17 ENCOUNTER — Ambulatory Visit: Payer: Medicare Other | Admitting: Nurse Practitioner

## 2022-12-12 ENCOUNTER — Other Ambulatory Visit: Payer: Self-pay

## 2022-12-12 ENCOUNTER — Ambulatory Visit
Admission: RE | Admit: 2022-12-12 | Discharge: 2022-12-12 | Disposition: A | Discharge status: Home / Self Care | Payer: Medicare Other | Source: Ambulatory Visit | Attending: Internal Medicine | Admitting: Internal Medicine

## 2022-12-12 ENCOUNTER — Ambulatory Visit: Payer: Medicare Other | Admitting: Anesthesiology

## 2022-12-12 ENCOUNTER — Encounter
Admission: RE | Disposition: A | Discharge status: Home / Self Care | Payer: Self-pay | Source: Ambulatory Visit | Attending: Internal Medicine

## 2022-12-12 ENCOUNTER — Encounter: Payer: Self-pay | Admitting: Internal Medicine

## 2022-12-12 DIAGNOSIS — Z79899 Other long term (current) drug therapy: Secondary | ICD-10-CM | POA: Insufficient documentation

## 2022-12-12 DIAGNOSIS — Z1211 Encounter for screening for malignant neoplasm of colon: Secondary | ICD-10-CM | POA: Diagnosis not present

## 2022-12-12 DIAGNOSIS — K219 Gastro-esophageal reflux disease without esophagitis: Secondary | ICD-10-CM | POA: Insufficient documentation

## 2022-12-12 DIAGNOSIS — Z86010 Personal history of colonic polyps: Secondary | ICD-10-CM | POA: Insufficient documentation

## 2022-12-12 DIAGNOSIS — I10 Essential (primary) hypertension: Secondary | ICD-10-CM | POA: Diagnosis not present

## 2022-12-12 DIAGNOSIS — Z800 Family history of malignant neoplasm of digestive organs: Secondary | ICD-10-CM | POA: Insufficient documentation

## 2022-12-12 DIAGNOSIS — D123 Benign neoplasm of transverse colon: Secondary | ICD-10-CM | POA: Insufficient documentation

## 2022-12-12 DIAGNOSIS — K640 First degree hemorrhoids: Secondary | ICD-10-CM | POA: Diagnosis not present

## 2022-12-12 DIAGNOSIS — Z09 Encounter for follow-up examination after completed treatment for conditions other than malignant neoplasm: Secondary | ICD-10-CM | POA: Insufficient documentation

## 2022-12-12 DIAGNOSIS — D120 Benign neoplasm of cecum: Secondary | ICD-10-CM | POA: Diagnosis not present

## 2022-12-12 DIAGNOSIS — D126 Benign neoplasm of colon, unspecified: Secondary | ICD-10-CM | POA: Diagnosis not present

## 2022-12-12 HISTORY — PX: COLONOSCOPY WITH PROPOFOL: SHX5780

## 2022-12-12 SURGERY — COLONOSCOPY WITH PROPOFOL
Anesthesia: General

## 2022-12-12 MED ORDER — PROPOFOL 500 MG/50ML IV EMUL
INTRAVENOUS | Status: DC | PRN
  Administered 2022-12-12: 150 ug/kg/min via INTRAVENOUS

## 2022-12-12 MED ORDER — PROPOFOL 10 MG/ML IV BOLUS
INTRAVENOUS | Status: DC | PRN
  Administered 2022-12-12 (×2): 50 mg via INTRAVENOUS

## 2022-12-12 MED ORDER — SODIUM CHLORIDE 0.9 % IV SOLN: INTRAVENOUS | Status: DC

## 2022-12-12 NOTE — Anesthesia Preprocedure Evaluation (Signed)
Anesthesia Evaluation  Patient identified by MRN, date of birth, ID band Patient awake    Reviewed: Allergy & Precautions, NPO status , Patient's Chart, lab work & pertinent test results  History of Anesthesia Complications Negative for: history of anesthetic complications  Airway Mallampati: II  TM Distance: >3 FB Neck ROM: full    Dental no notable dental hx.    Pulmonary neg pulmonary ROS   Pulmonary exam normal        Cardiovascular hypertension, On Medications negative cardio ROS Normal cardiovascular exam     Neuro/Psych  Neuromuscular disease  negative psych ROS   GI/Hepatic Neg liver ROS,GERD  Medicated,,  Endo/Other  negative endocrine ROS    Renal/GU negative Renal ROS  negative genitourinary   Musculoskeletal   Abdominal   Peds  Hematology negative hematology ROS (+)   Anesthesia Other Findings Past Medical History: No date: Anemia No date: Breast screening, unspecified No date: Eczema No date: Family history of colonic polyps No date: Lump or mass in breast No date: Neoplasm of uncertain behavior of breast No date: Personal history of colonic polyps No date: Special screening for malignant neoplasms, colon No date: Unspecified essential hypertension  Past Surgical History: No date: ABDOMINAL HYSTERECTOMY Age 67: BREAST BIOPSY; Right 07/21/2012: BREAST BIOPSY; Left     Comment:  Core biopsy showed atypical lobular hyperplasia and a 2               mm foci, atypical ductal hyperplasia and a 1 mm foci.               There was no evidence of in situ or invasive cancer 05/01/2012: BREAST CYST ASPIRATION     Comment:  inconclusive, with atypical ductal cells identified 2002, 2005, 2009, 2012: COLONOSCOPY     Comment:  polyps  Age 72: TOTAL ABDOMINAL HYSTERECTOMY W/ BILATERAL SALPINGOOPHORECTOMY  BMI    Body Mass Index: 25.84 kg/m      Reproductive/Obstetrics negative OB ROS                              Anesthesia Physical Anesthesia Plan  ASA: 2  Anesthesia Plan: General   Post-op Pain Management: Minimal or no pain anticipated   Induction: Intravenous  PONV Risk Score and Plan: Propofol infusion and TIVA  Airway Management Planned: Natural Airway and Nasal Cannula  Additional Equipment:   Intra-op Plan:   Post-operative Plan:   Informed Consent: I have reviewed the patients History and Physical, chart, labs and discussed the procedure including the risks, benefits and alternatives for the proposed anesthesia with the patient or authorized representative who has indicated his/her understanding and acceptance.     Dental Advisory Given  Plan Discussed with: Anesthesiologist, CRNA and Surgeon  Anesthesia Plan Comments: (Patient consented for risks of anesthesia including but not limited to:  - adverse reactions to medications - risk of airway placement if required - damage to eyes, teeth, lips or other oral mucosa - nerve damage due to positioning  - sore throat or hoarseness - Damage to heart, brain, nerves, lungs, other parts of body or loss of life  Patient voiced understanding.)       Anesthesia Quick Evaluation

## 2022-12-12 NOTE — Interval H&P Note (Signed)
History and Physical Interval Note:  12/12/2022 1:15 PM  Laurie Gross  has presented today for surgery, with the diagnosis of H/O TA Polyps.  The various methods of treatment have been discussed with the patient and family. After consideration of risks, benefits and other options for treatment, the patient has consented to  Procedure(s): COLONOSCOPY WITH PROPOFOL (N/A) as a surgical intervention.  The patient's history has been reviewed, patient examined, no change in status, stable for surgery.  I have reviewed the patient's chart and labs.  Questions were answered to the patient's satisfaction.     [place], [place]

## 2022-12-12 NOTE — Anesthesia Postprocedure Evaluation (Signed)
Anesthesia Post Note  Patient: Laurie Gross  Procedure(s) Performed: COLONOSCOPY WITH PROPOFOL  Patient location during evaluation: Endoscopy Anesthesia Type: General Level of consciousness: awake and alert Pain management: pain level controlled Vital Signs Assessment: post-procedure vital signs reviewed and stable Respiratory status: spontaneous breathing, nonlabored ventilation, respiratory function stable and patient connected to nasal cannula oxygen Cardiovascular status: blood pressure returned to baseline and stable Postop Assessment: no apparent nausea or vomiting Anesthetic complications: no   There were no known notable events for this encounter.   Last Vitals:  Vitals:   12/12/22 1354 12/12/22 1404  BP: (!) 127/57 134/66  Pulse: 87 68  Resp: 19 13  Temp:    SpO2: 99% 100%    Last Pain:  Vitals:   12/12/22 1404  TempSrc:   PainSc: 0-No pain                 Ilene Qua

## 2022-12-12 NOTE — H&P (Signed)
Outpatient short stay form Pre-procedure 12/12/2022 1:14 PM Laurie Guzzetta K. [name], M.D.  Primary Physician: Jonetta Osgood, NP  Reason for visit:  Personal history of adenomatous colon polyps, Family history of colon cancer - Father.  History of present illness:  [name] presents to the [facility] at the request of her PCP for chief complaint of high-risk colon cancer surveillance 2/2 personal hx of adenomatous colon polyps. Last colonoscopy 03/2017 performed at [facility] showed tortuous colon, diverticulosis in transverse colon, and one 5 mm TA removed from cecum. She has family history of colon cancer in her father diagnosed in his 3s. She is here to schedule repeat colonoscopy. She reports no recent changes in her bowel habits. She reports on average having 2-3 bowel movements a week.     Current Facility-Administered Medications:    0.9 %  sodium chloride infusion, , Intravenous, Continuous, [place], Benay Pike, MD, Last Rate: 20 mL/hr at 12/12/22 1237, Continued from Pre-op at 12/12/22 1237  Medications Prior to Admission  Medication Sig Dispense Refill Last Dose   amLODipine (NORVASC) 10 MG tablet Take 1 tablet (10 mg total) by mouth daily. 90 tablet 1 12/11/2022   Cholecalciferol (VITAMIN D-3) 5000 units TABS Take 5,000 Units by mouth daily.   12/11/2022   Docusate Sodium (STOOL SOFTENER LAXATIVE PO) Take 1 Dose by mouth as needed.   [id number]   folic acid (FOLVITE) 1 MG tablet TAKE 1 TABLET BY MOUTH EVERY DAY 90 tablet 1 12/11/2022   icosapent Ethyl (VASCEPA) 1 g capsule Take 2 capsules (2 g total) by mouth 2 (two) times daily. 120 capsule 3 12/11/2022   lisinopril (ZESTRIL) 40 MG tablet Take 1 tablet (40 mg total) by mouth daily. 90 tablet 1 12/12/2022 at 0800   Multiple Vitamins-Iron (MULTIVITAMIN/IRON PO) Take 1 capsule by mouth daily.   12/11/2022   niacin (NIASPAN) 500 MG CR tablet TAKE 1 TABLET(500 MG) BY MOUTH AT BEDTIME 90 tablet 0 12/11/2022   pantoprazole (PROTONIX) 40 MG tablet  TAKE 1 TABLET(40 MG) BY MOUTH TWICE DAILY 180 tablet 1 12/11/2022   rosuvastatin (CRESTOR) 5 MG tablet TAKE 1 TABLET(5 MG) BY MOUTH DAILY 90 tablet 3 12/11/2022   Saccharomyces boulardii (FLORASTOR PO) Take 1 capsule by mouth 3 (three) times a week.   12/11/2022   triamcinolone cream (KENALOG) 0.1 % Apply 1 application topically 2 (two) times daily. (Patient taking differently: Apply 1 application  topically as needed.) 30 g 0 12/11/2022   vitamin B-12 (CYANOCOBALAMIN) 1000 MCG tablet Take 1,000 mcg by mouth daily.   12/11/2022     Allergies  Allergen Reactions   Hydrochlorothiazide Other (See Comments)    History of pancreatitis   Amoxicillin Nausea Only   Meloxicam Other (See Comments)   Penicillins Nausea Only   Simvastatin Nausea Only   Sulfamethoxazole-Trimethoprim Nausea Only   Tramadol Nausea Only     Past Medical History:  Diagnosis Date   Anemia    Breast screening, unspecified    Eczema    Family history of colonic polyps    Lump or mass in breast    Neoplasm of uncertain behavior of breast    Personal history of colonic polyps    Special screening for malignant neoplasms, colon    Unspecified essential hypertension     Review of systems:  Otherwise negative.    Physical Exam  Gen: Alert, oriented. Appears stated age.  HEENT: [place]/AT. PERRLA. Lungs: CTA, no wheezes. CV: RR nl S1, S2. Abd: soft, benign, no masses.  BS+ Ext: No edema. Pulses 2+    Planned procedures: Proceed with colonoscopy. The patient understands the nature of the planned procedure, indications, risks, alternatives and potential complications including but not limited to bleeding, infection, perforation, damage to internal organs and possible oversedation/side effects from anesthesia. The patient agrees and gives consent to proceed.  Please refer to procedure notes for findings, recommendations and patient disposition/instructions.     Red Mandt K. [name], M.D. Gastroenterology 12/12/2022  1:14  PM

## 2022-12-12 NOTE — Transfer of Care (Signed)
Immediate Anesthesia Transfer of Care Note  Patient: Laurie Gross  Procedure(s) Performed: COLONOSCOPY WITH PROPOFOL  Patient Location: PACU  Anesthesia Type:General  Level of Consciousness: awake and sedated  Airway & Oxygen Therapy: Patient Spontanous Breathing and Patient connected to nasal cannula oxygen  Post-op Assessment: Report given to RN and Post -op Vital signs reviewed and stable  Post vital signs: Reviewed and stable  Last Vitals:  Vitals Value Taken Time  BP    Temp    Pulse    Resp    SpO2      Last Pain:  Vitals:   12/12/22 1214  TempSrc: Temporal  PainSc: 0-No pain         Complications: There were no known notable events for this encounter.

## 2022-12-12 NOTE — Op Note (Signed)
[facility]ology Patient Name: Laurie Gross Procedure Date: 12/12/2022 1:15 PM MRN: [id number] Account #: [id number] Date of Birth: [birth year] Admit Type: Outpatient Age: 67 Room: [facility] ENDO ROOM 2 Gender: Female Note Status: Finalized Instrument Name: Jasper Riling [id number] Procedure:             Colonoscopy Indications:           Surveillance: Personal history of adenomatous polyps                         on last colonoscopy > 5 years ago Providers:             Lorie Apley K. [name] MD, MD Referring MD:          No Local Md, MD (Referring MD) Medicines:             Propofol per Anesthesia Complications:         No immediate complications. Procedure:             Pre-Anesthesia Assessment:                        - The risks and benefits of the procedure and the                         sedation options and risks were discussed with the                         patient. All questions were answered and informed                         consent was obtained.                        - Patient identification and proposed procedure were                         verified prior to the procedure by the nurse. The                         procedure was verified in the procedure room.                        - ASA Grade Assessment: III - A patient with severe                         systemic disease.                        - After reviewing the risks and benefits, the patient                         was deemed in satisfactory condition to undergo the                         procedure.                        After obtaining informed consent, the colonoscope was  passed under direct vision. Throughout the procedure,                         the patient's blood pressure, pulse, and oxygen                         saturations were monitored continuously. The                         Colonoscope was introduced through the anus and                          advanced to the the cecum, identified by appendiceal                         orifice and ileocecal valve. The colonoscopy was                         performed with ease. The patient tolerated the                         procedure well. The quality of the bowel preparation                         was good. The ileocecal valve, appendiceal orifice,                         and rectum were photographed. Findings:      The perianal and digital rectal examinations were normal. Pertinent       negatives include normal sphincter tone and no palpable rectal lesions.      Non-bleeding internal hemorrhoids were found during retroflexion. The       hemorrhoids were Grade I (internal hemorrhoids that do not prolapse).      A 4 mm polyp was found in the cecum. The polyp was sessile. The polyp       was removed with a jumbo cold forceps. Resection and retrieval were       complete.      A 12 mm polyp was found in the distal transverse colon. The polyp was       sessile. The polyp was removed with a hot snare. Resection and retrieval       were complete.      The exam was otherwise without abnormality. Impression:            - Non-bleeding internal hemorrhoids.                        - One 4 mm polyp in the cecum, removed with a jumbo                         cold forceps. Resected and retrieved.                        - One 12 mm polyp in the distal transverse colon,                         removed with a hot snare. Resected and retrieved.                        -  The examination was otherwise normal. Recommendation:        - Patient has a contact number available for                         emergencies. The signs and symptoms of potential                         delayed complications were discussed with the patient.                         Return to normal activities tomorrow. Written                         discharge instructions were provided to the patient.                        - Resume previous  diet.                        - Continue present medications.                        - Repeat colonoscopy is recommended for surveillance.                         The colonoscopy date will be determined after                         pathology results from today's exam become available                         for review.                        - Return to GI office PRN.                        - The findings and recommendations were discussed with                         the patient. Procedure Code(s):     --- Professional ---                        [id number], Colonoscopy, flexible; with removal of                         tumor(s), polyp(s), or other lesion(s) by snare                         technique                        45380, 53, Colonoscopy, flexible; with biopsy, single                         or multiple Diagnosis Code(s):     --- Professional ---                        K64.0, First degree hemorrhoids  D12.3, Benign neoplasm of transverse colon (hepatic                         flexure or splenic flexure)                        D12.0, Benign neoplasm of cecum                        Z86.010, Personal history of colonic polyps CPT copyright 2022 American [employer]. All rights reserved. The codes documented in this report are preliminary and upon coder review may  be revised to meet current compliance requirements. Efrain Sella MD, MD 12/12/2022 1:43:35 PM This report has been signed electronically. Number of Addenda: 0 Note Initiated On: 12/12/2022 1:15 PM Scope Withdrawal Time: 0 hours 7 minutes 23 seconds  Total Procedure Duration: 0 hours 11 minutes 37 seconds  Estimated Blood Loss:  Estimated blood loss: none.      [facility]

## 2022-12-13 ENCOUNTER — Encounter: Payer: Self-pay | Admitting: Internal Medicine

## 2022-12-13 LAB — SURGICAL PATHOLOGY

## 2022-12-22 ENCOUNTER — Other Ambulatory Visit: Payer: Self-pay | Admitting: Nurse Practitioner

## 2022-12-22 DIAGNOSIS — I10 Essential (primary) hypertension: Secondary | ICD-10-CM

## 2022-12-24 ENCOUNTER — Telehealth: Payer: Self-pay

## 2022-12-24 NOTE — Telephone Encounter (Signed)
Try to call pt her voicemail is full

## 2022-12-25 ENCOUNTER — Other Ambulatory Visit: Payer: Self-pay | Admitting: Nurse Practitioner

## 2022-12-25 DIAGNOSIS — I10 Essential (primary) hypertension: Secondary | ICD-10-CM

## 2023-02-24 ENCOUNTER — Other Ambulatory Visit: Payer: Self-pay | Admitting: Nurse Practitioner

## 2023-02-24 DIAGNOSIS — I10 Essential (primary) hypertension: Secondary | ICD-10-CM

## 2023-02-26 ENCOUNTER — Other Ambulatory Visit: Payer: Self-pay | Admitting: Internal Medicine

## 2023-02-26 DIAGNOSIS — I10 Essential (primary) hypertension: Secondary | ICD-10-CM

## 2023-03-18 DIAGNOSIS — Z1231 Encounter for screening mammogram for malignant neoplasm of breast: Secondary | ICD-10-CM | POA: Diagnosis not present

## 2023-03-18 DIAGNOSIS — Z1159 Encounter for screening for other viral diseases: Secondary | ICD-10-CM | POA: Diagnosis not present

## 2023-03-18 DIAGNOSIS — E7801 Familial hypercholesterolemia: Secondary | ICD-10-CM | POA: Diagnosis not present

## 2023-03-18 DIAGNOSIS — Z1329 Encounter for screening for other suspected endocrine disorder: Secondary | ICD-10-CM | POA: Diagnosis not present

## 2023-03-19 ENCOUNTER — Telehealth: Payer: Self-pay | Admitting: Nurse Practitioner

## 2023-03-19 NOTE — Telephone Encounter (Signed)
pt will be out of town she will call back to reschedule-nm

## 2023-03-26 ENCOUNTER — Ambulatory Visit: Payer: Medicare Other | Admitting: Nurse Practitioner

## 2023-04-08 DIAGNOSIS — Z130 Encounter for screening for diseases of the blood and blood-forming organs and certain disorders involving the immune mechanism: Secondary | ICD-10-CM | POA: Diagnosis not present

## 2023-04-08 DIAGNOSIS — Z1329 Encounter for screening for other suspected endocrine disorder: Secondary | ICD-10-CM | POA: Diagnosis not present

## 2023-04-08 DIAGNOSIS — Z1159 Encounter for screening for other viral diseases: Secondary | ICD-10-CM | POA: Diagnosis not present

## 2023-04-08 DIAGNOSIS — E7801 Familial hypercholesterolemia: Secondary | ICD-10-CM | POA: Diagnosis not present

## 2023-04-08 DIAGNOSIS — Z131 Encounter for screening for diabetes mellitus: Secondary | ICD-10-CM | POA: Diagnosis not present

## 2023-04-14 ENCOUNTER — Ambulatory Visit (INDEPENDENT_AMBULATORY_CARE_PROVIDER_SITE_OTHER): Payer: Medicare Other

## 2023-04-14 ENCOUNTER — Ambulatory Visit
Admission: EM | Admit: 2023-04-14 | Discharge: 2023-04-14 | Disposition: A | Discharge status: Home / Self Care | Payer: Medicare Other | Attending: Family Medicine | Admitting: Family Medicine

## 2023-04-14 DIAGNOSIS — J029 Acute pharyngitis, unspecified: Secondary | ICD-10-CM | POA: Diagnosis not present

## 2023-04-14 DIAGNOSIS — R059 Cough, unspecified: Secondary | ICD-10-CM | POA: Diagnosis not present

## 2023-04-14 DIAGNOSIS — J069 Acute upper respiratory infection, unspecified: Secondary | ICD-10-CM | POA: Diagnosis not present

## 2023-04-14 DIAGNOSIS — J988 Other specified respiratory disorders: Secondary | ICD-10-CM | POA: Diagnosis not present

## 2023-04-14 DIAGNOSIS — B9789 Other viral agents as the cause of diseases classified elsewhere: Secondary | ICD-10-CM

## 2023-04-14 DIAGNOSIS — R051 Acute cough: Secondary | ICD-10-CM | POA: Diagnosis not present

## 2023-04-14 LAB — GROUP A STREP BY PCR: Group A Strep by PCR: NOT DETECTED

## 2023-04-14 MED ORDER — BENZONATATE 100 MG PO CAPS: 100.0000 mg | ORAL_CAPSULE | Freq: Three times a day (TID) | ORAL | 0 refills | Status: DC

## 2023-04-14 MED ORDER — IPRATROPIUM BROMIDE 0.06 % NA SOLN: 2.0000 | Freq: Four times a day (QID) | NASAL | 12 refills | Status: DC

## 2023-04-14 MED ORDER — PROMETHAZINE-DM 6.25-15 MG/5ML PO SYRP: 5.0000 mL | ORAL_SOLUTION | Freq: Four times a day (QID) | ORAL | 0 refills | Status: DC | PRN

## 2023-04-14 NOTE — ED Triage Notes (Signed)
Patient presents to UC for sore throat and cough x 5 days. Treating with nyquil.   Denies fever, SOB.

## 2023-04-14 NOTE — Discharge Instructions (Addendum)
Your strep test is negative.  Your chest x-ray did not show any pneumonia.  Stop by the pharmacy to pick up your prescriptions.  Take the Promethazine DM mostly at night for cough but you can use it throughout the day as well but it may make you sleepy.  Tessalon Perles are also good for cough throughout the day.The Atrovent nasal spray will help dry up your nasal secretions.     You can take Tylenol and/or Ibuprofen as needed for fever reduction and pain relief.    For cough: honey 1/2 to 1 teaspoon (you can dilute the honey in water or another fluid).  You can also use guaifenesin and dextromethorphan for cough. You can use a humidifier for chest congestion and cough.  If you don't have a humidifier, you can sit in the bathroom with the hot shower running.      For sore throat: try warm salt water gargles, Mucinex sore throat cough drops or cepacol lozenges, throat spray, warm tea or water with lemon/honey, popsicles or ice, or OTC cold relief medicine for throat discomfort. You can also purchase chloraseptic spray at the pharmacy or dollar store.   For congestion: take a daily anti-histamine like Zyrtec, Claritin, and a oral decongestant, such as pseudoephedrine.  You can also use Flonase 1-2 sprays in each nostril daily. Afrin is also a good option, if you do not have high blood pressure.    It is important to stay hydrated: drink plenty of fluids (water, gatorade/powerade/pedialyte, juices, or teas) to keep your throat moisturized and help further relieve irritation/discomfort.    Return or go to the Emergency Department if symptoms worsen or do not improve in the next week

## 2023-04-14 NOTE — ED Provider Notes (Signed)
[facility]    CSN: [id number] Arrival date & time: 04/14/23  0810      History   Chief Complaint Chief Complaint  Laurie Gross presents with   Sore Throat   Cough    HPI Laurie Gross is a 67 y.o. female.   HPI  History obtained from the Laurie Gross. Laurie Gross presents for sore throat and cough that started 5 days ago.  She was around someone with COVID and took a home COVID test that was  negative.  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  Denies fever.  Has nasal congestion with productive cough and green sputum.  Denies ear pain, vomiting, diarrhea.  She is concerned that she may need an antibiotic to cover her symptoms.  Took some NyQuil and drinks moderate liquids for her throat.  Says that she is now hoarse and has painful swallowing.       Past Medical History:  Diagnosis Date   Anemia    Breast screening, unspecified    Eczema    Family history of colonic polyps    Lump or mass in breast    Neoplasm of uncertain behavior of breast    Personal history of colonic polyps    Special screening for malignant neoplasms, colon    Unspecified essential hypertension     Laurie Gross Active Problem List   Diagnosis Date Noted   Mixed hyperlipidemia 03/21/2021   Family history of colon cancer 04/08/2017   Hx of colonic polyps 04/08/2017   Cervical radiculopathy 12/15/2014   Anemia 02/14/2014   Vitamin D deficiency, unspecified 02/14/2014   GERD (gastroesophageal reflux disease) 02/12/2014   Essential hypertension, benign 02/12/2014   Pseudocyst of pancreas 10/01/2013    Past Surgical History:  Procedure Laterality Date   ABDOMINAL HYSTERECTOMY     BREAST BIOPSY Right Age 21   BREAST BIOPSY Left 07/21/2012   Core biopsy showed atypical lobular hyperplasia and a 2 mm foci, atypical ductal hyperplasia and a 1 mm foci. There was no evidence of in situ or invasive cancer   BREAST CYST ASPIRATION   05/01/2012   inconclusive, with atypical ductal cells identified   COLONOSCOPY  2002, 2005, 2009, 2012   polyps    COLONOSCOPY WITH PROPOFOL N/A 12/12/2022   Procedure: COLONOSCOPY WITH PROPOFOL;  Surgeon: Toledo, Boykin Nearing, MD;  Location: [facility] ENDOSCOPY;  Service: Gastroenterology;  Laterality: N/A;   TOTAL ABDOMINAL HYSTERECTOMY W/ BILATERAL SALPINGOOPHORECTOMY  Age 75    OB History   No obstetric history on file.    Obstetric Comments  Age first menstrual cycle 11 Age last menstrual cycle 35- due to hysterectomy          Home Medications    Prior to Admission medications   Medication Sig Start Date End Date Taking? Authorizing Provider  benzonatate (TESSALON) 100 MG capsule Take 1 capsule (100 mg total) by mouth every 8 (eight) hours. 04/14/23  Yes Welcome Fults, DO  ipratropium (ATROVENT) 0.06 % nasal spray Place 2 sprays into both nostrils 4 (four) times daily. 04/14/23  Yes Glenda Spelman, DO  promethazine-dextromethorphan (PROMETHAZINE-DM) 6.25-15 MG/5ML syrup Take 5 mLs by mouth 4 (four) times daily as needed. 04/14/23  Yes Kiam Bransfield, DO  amLODipine (NORVASC) 10 MG tablet TAKE 1 TABLET(10 MG) BY MOUTH DAILY 02/26/23   Lyndon Code, MD  Cholecalciferol (VITAMIN D-3) 5000 units TABS Take 5,000 Units by mouth daily.    [provider]  Docusate Sodium (STOOL SOFTENER LAXATIVE PO) Take 1 Dose by mouth as needed.    [provider]  folic acid (FOLVITE) 1 MG tablet TAKE 1 TABLET BY MOUTH EVERY DAY 01/16/22   Creig Hines, MD  icosapent Ethyl (VASCEPA) 1 g capsule Take 2 capsules (2 g total) by mouth 2 (two) times daily. 03/20/22   Sallyanne Kuster, NP  lisinopril (ZESTRIL) 40 MG tablet Take 1 tablet (40 mg total) by mouth daily. 03/20/22   Sallyanne Kuster, NP  Multiple Vitamins-Iron (MULTIVITAMIN/IRON PO) Take 1 capsule by mouth daily.    [provider]  niacin (NIASPAN) 500 MG CR tablet TAKE 1 TABLET(500 MG) BY MOUTH AT BEDTIME 12/27/21    Abernathy, Arlyss Repress, NP  pantoprazole (PROTONIX) 40 MG tablet TAKE 1 TABLET(40 MG) BY MOUTH TWICE DAILY 02/15/22   Abernathy, Arlyss Repress, NP  rosuvastatin (CRESTOR) 5 MG tablet TAKE 1 TABLET(5 MG) BY MOUTH DAILY 09/15/21   Sallyanne Kuster, NP  Saccharomyces boulardii (FLORASTOR PO) Take 1 capsule by mouth 3 (three) times a week.    [provider]  triamcinolone cream (KENALOG) 0.1 % Apply 1 application topically 2 (two) times daily. Laurie Gross taking differently: Apply 1 application  topically as needed. 08/19/18   Johnna Acosta, NP  vitamin B-12 (CYANOCOBALAMIN) 1000 MCG tablet Take 1,000 mcg by mouth daily.    [provider]    Family History Family History  Problem Relation Age of Onset   Colon cancer Father    Colon cancer Other        Paternal first cousin   Hypertension Mother    Anemia Mother     Social History Social History   Tobacco Use   Smoking status: Never   Smokeless tobacco: Never  Vaping Use   Vaping status: Never Used  Substance Use Topics   Alcohol use: Not Currently   Drug use: No     Allergies   Hydrochlorothiazide, Amoxicillin, Meloxicam, Penicillins, Simvastatin, Sulfamethoxazole-trimethoprim, and Tramadol   Review of Systems Review of Systems: negative unless otherwise stated in HPI.      Physical Exam Triage Vital Signs ED Triage Vitals  Encounter Vitals Group     BP 04/14/23 0822 122/60     Systolic BP Percentile --      Diastolic BP Percentile --      Pulse Rate 04/14/23 0822 79     Resp 04/14/23 0822 16     Temp 04/14/23 0822 99 F (37.2 C)     Temp Source 04/14/23 0822 Oral     SpO2 04/14/23 0822 100 %     Weight --      Height --      Head Circumference --      Peak Flow --      Pain Score 04/14/23 0819 0     Pain Loc --      Pain Education --      Exclude from Growth Chart --    No data found.  Updated Vital Signs BP 122/60 (BP Location: Left Arm)   Pulse 79   Temp 99 F (37.2 C) (Oral)   Resp 16    SpO2 100%   Visual Acuity Right Eye Distance:   Left Eye Distance:   Bilateral Distance:    Right Eye Near:   Left Eye Near:    Bilateral Near:     Physical Exam GEN:     alert, ill but non-toxic appearing female in no distress  HENT:  mucus membranes moist, oropharyngeal without lesions, mild erythema, no tonsillar hypertrophy or exudates,+ nasal discharge, bilateral TM normal EYES:   pupils equal and reactive, no scleral injection or discharge NECK:  normal ROM, no lymphadenopathy, no meningismus   RESP:  no increased work of breathing, clear to auscultation bilaterally CVS:   regular rate and rhythm Skin:   warm and dry, no rash on visible skin    UC Treatments / Results  Labs (all labs ordered are listed, but only abnormal results are displayed) Labs Reviewed  GROUP A STREP BY PCR    EKG   Radiology DG Chest 2 View  Result Date: 04/14/2023 CLINICAL DATA:  Sore throat and cough for the past 5 days. EXAM: CHEST - 2 VIEW COMPARISON:  Chest x-ray dated April 13, 2020. FINDINGS: The heart size and mediastinal contours are within normal limits. Both lungs are clear. The visualized skeletal structures are unremarkable. IMPRESSION: No active cardiopulmonary disease. Electronically Signed   By: Obie Dredge M.D.   On: 04/14/2023 09:34     Procedures Procedures (including critical care time)  Medications Ordered in UC Medications - No data to display  Initial Impression / Assessment and Plan / UC Course  I have reviewed the triage vital signs and the nursing notes.  Pertinent labs & imaging results that were available during my care of the Laurie Gross were reviewed by me and considered in my medical decision making (see chart for details).       Laurie Gross is a 67 y.o. female who presents for 5 days of respiratory symptoms. Laurie Gross has an elevated temperature here 99 F.Laurie Gross well on room air. Overall Laurie Gross is ill but non-toxic appearing, well hydrated, without respiratory  distress. Pulmonary exam is unremarkable.  Strep testing obtained and was negative.  Home COVID test was negative.  Laurie Gross concerned for pneumonia and therefore a chest x-ray obtained.  Chest xray personally reviewed by me without focal pneumonia, pleural effusion, cardiomegaly or pneumothorax.  Radiologist report reviewed.  History consistent with viral respiratory illness. Discussed symptomatic treatment.  Explained at length lack of efficacy of antibiotics in viral disease.  Typical duration of symptoms discussed.  Prescribed Promethazine DM and Tessalon Perles for cough and Atrovent nasal spray for nasal congestion.  Return and ED precautions given and voiced understanding. Discussed MDM, treatment plan and plan for follow-up with Laurie Gross who agrees with plan.     Final Clinical Impressions(s) / UC Diagnoses   Final diagnoses:  Acute cough  Viral respiratory illness     Discharge Instructions      Your strep test is negative.  Your chest x-ray did not show any pneumonia.  Stop by the pharmacy to pick up your prescriptions.  Take the Promethazine DM mostly at night for cough but you can use it throughout the day as well but it may make you sleepy.  Tessalon Perles are also good for cough throughout the day.The Atrovent nasal spray will help dry up your nasal secretions.     You can take Tylenol and/or Ibuprofen as needed for fever reduction and pain relief.    For cough: honey 1/2 to 1 teaspoon (you can dilute the honey in water or another fluid).  You can also use guaifenesin and dextromethorphan for cough. You can use a humidifier for chest congestion and cough.  If you don't have a humidifier, you can sit in the bathroom with the hot shower running.      For sore throat: try  warm salt water gargles, Mucinex sore throat cough drops or cepacol lozenges, throat spray, warm tea or water with lemon/honey, popsicles or ice, or OTC cold relief medicine for throat discomfort. You can also  purchase chloraseptic spray at the pharmacy or dollar store.   For congestion: take a daily anti-histamine like Zyrtec, Claritin, and a oral decongestant, such as pseudoephedrine.  You can also use Flonase 1-2 sprays in each nostril daily. Afrin is also a good option, if you do not have high blood pressure.    It is important to stay hydrated: drink plenty of fluids (water, gatorade/powerade/pedialyte, juices, or teas) to keep your throat moisturized and help further relieve irritation/discomfort.    Return or go to the Emergency Department if symptoms worsen or do not improve in the next week       ED Prescriptions     Medication Sig Dispense Auth. Provider   promethazine-dextromethorphan (PROMETHAZINE-DM) 6.25-15 MG/5ML syrup Take 5 mLs by mouth 4 (four) times daily as needed. 118 mL Shannah Conteh, DO   benzonatate (TESSALON) 100 MG capsule Take 1 capsule (100 mg total) by mouth every 8 (eight) hours. 21 capsule Loys Hoselton, DO   ipratropium (ATROVENT) 0.06 % nasal spray Place 2 sprays into both nostrils 4 (four) times daily. 15 mL Katha Cabal, DO      PDMP not reviewed this encounter.   Katha Cabal, DO 04/17/23 0840

## 2023-04-24 DIAGNOSIS — Z1231 Encounter for screening mammogram for malignant neoplasm of breast: Secondary | ICD-10-CM | POA: Diagnosis not present

## 2023-07-29 ENCOUNTER — Other Ambulatory Visit: Payer: Self-pay | Admitting: Internal Medicine

## 2023-07-29 ENCOUNTER — Other Ambulatory Visit: Payer: Self-pay | Admitting: Oncology

## 2023-07-29 ENCOUNTER — Other Ambulatory Visit: Payer: Self-pay | Admitting: Nurse Practitioner

## 2023-07-29 DIAGNOSIS — I10 Essential (primary) hypertension: Secondary | ICD-10-CM

## 2023-07-29 DIAGNOSIS — K2100 Gastro-esophageal reflux disease with esophagitis, without bleeding: Secondary | ICD-10-CM

## 2023-08-07 ENCOUNTER — Other Ambulatory Visit: Payer: Self-pay | Admitting: Internal Medicine

## 2023-08-07 DIAGNOSIS — I10 Essential (primary) hypertension: Secondary | ICD-10-CM

## 2023-08-13 DIAGNOSIS — H5203 Hypermetropia, bilateral: Secondary | ICD-10-CM | POA: Diagnosis not present

## 2023-08-13 DIAGNOSIS — H2513 Age-related nuclear cataract, bilateral: Secondary | ICD-10-CM | POA: Diagnosis not present

## 2023-08-13 DIAGNOSIS — H3540 Unspecified peripheral retinal degeneration: Secondary | ICD-10-CM | POA: Diagnosis not present

## 2023-08-13 DIAGNOSIS — H524 Presbyopia: Secondary | ICD-10-CM | POA: Diagnosis not present

## 2023-08-19 DIAGNOSIS — Z0000 Encounter for general adult medical examination without abnormal findings: Secondary | ICD-10-CM | POA: Diagnosis not present

## 2023-08-19 DIAGNOSIS — Z23 Encounter for immunization: Secondary | ICD-10-CM | POA: Diagnosis not present

## 2023-08-19 DIAGNOSIS — E7801 Familial hypercholesterolemia: Secondary | ICD-10-CM | POA: Diagnosis not present

## 2023-08-19 DIAGNOSIS — D509 Iron deficiency anemia, unspecified: Secondary | ICD-10-CM | POA: Diagnosis not present

## 2023-08-19 DIAGNOSIS — I10 Essential (primary) hypertension: Secondary | ICD-10-CM | POA: Diagnosis not present

## 2023-08-19 DIAGNOSIS — K219 Gastro-esophageal reflux disease without esophagitis: Secondary | ICD-10-CM | POA: Diagnosis not present

## 2023-08-19 DIAGNOSIS — Z13820 Encounter for screening for osteoporosis: Secondary | ICD-10-CM | POA: Diagnosis not present

## 2024-04-09 ENCOUNTER — Ambulatory Visit

## 2024-04-09 DIAGNOSIS — Z23 Encounter for immunization: Secondary | ICD-10-CM

## 2024-04-09 DIAGNOSIS — Z719 Counseling, unspecified: Secondary | ICD-10-CM

## 2024-04-09 NOTE — Progress Notes (Signed)
 In nurse clinic for immunizations, requesting MMR vaccine. RN explained recommended vaccines and schedule to patient; agreed to receive vaccines. Voices no concerns. VIS reviewed and given to patient. Vaccine (MMR) tolerated well; no issues noted. [facility] updated and copy given to patient.   Doyce CINDERELLA Shuck, RN

## 2024-07-04 ENCOUNTER — Ambulatory Visit
Admission: EM | Admit: 2024-07-04 | Discharge: 2024-07-04 | Disposition: A | Discharge status: Home / Self Care | Attending: Student | Admitting: Student

## 2024-07-04 ENCOUNTER — Encounter: Payer: Self-pay | Admitting: Emergency Medicine

## 2024-07-04 ENCOUNTER — Ambulatory Visit (INDEPENDENT_AMBULATORY_CARE_PROVIDER_SITE_OTHER)

## 2024-07-04 DIAGNOSIS — J208 Acute bronchitis due to other specified organisms: Secondary | ICD-10-CM | POA: Diagnosis not present

## 2024-07-04 DIAGNOSIS — R051 Acute cough: Secondary | ICD-10-CM | POA: Diagnosis not present

## 2024-07-04 MED ORDER — PREDNISONE 20 MG PO TABS
20.0000 mg | ORAL_TABLET | Freq: Every day | ORAL | 0 refills | Status: AC
Start: 2024-07-04 — End: 2024-07-09

## 2024-07-04 MED ORDER — BENZONATATE 100 MG PO CAPS: 100.0000 mg | ORAL_CAPSULE | Freq: Three times a day (TID) | ORAL | 0 refills | Status: DC

## 2024-07-04 NOTE — ED Provider Notes (Signed)
 [facility]    CSN: [id number] Arrival date & time: 07/04/24  0802      History   Chief Complaint Chief Complaint  Patient presents with   Cough    HPI Laurie Gross is a 68 y.o. female presenting with cough, myalgias, chest congestion for about 2 weeks.  The cough was initially productive of white mucus, but it is getting darker in color.  She notes a pain of the left upper back when she coughs.  Denies known fevers..  Denies chest pain, shortness of breath.  Denies history of pulmonary disease, including asthma, COPD, smoking.  HPI  Past Medical History:  Diagnosis Date   Anemia    Breast screening, unspecified    Eczema    Family history of colonic polyps    Lump or mass in breast    Neoplasm of uncertain behavior of breast    Personal history of colonic polyps    Special screening for malignant neoplasms, colon    Unspecified essential hypertension     Patient Active Problem List   Diagnosis Date Noted   Mixed hyperlipidemia 03/21/2021   Family history of colon cancer 04/08/2017   Hx of colonic polyps 04/08/2017   Cervical radiculopathy 12/15/2014   Anemia 02/14/2014   Vitamin D  deficiency, unspecified 02/14/2014   GERD (gastroesophageal reflux disease) 02/12/2014   Essential hypertension, benign 02/12/2014   Pseudocyst of pancreas 10/01/2013    Past Surgical History:  Procedure Laterality Date   ABDOMINAL HYSTERECTOMY     BREAST BIOPSY Right Age 61   BREAST BIOPSY Left 07/21/2012   Core biopsy showed atypical lobular hyperplasia and a 2 mm foci, atypical ductal hyperplasia and a 1 mm foci. There was no evidence of in situ or invasive cancer   BREAST CYST ASPIRATION  05/01/2012   inconclusive, with atypical ductal cells identified   COLONOSCOPY  2002, 2005, 2009, 2012   polyps    COLONOSCOPY WITH PROPOFOL  N/A 12/12/2022   Procedure: COLONOSCOPY WITH PROPOFOL ;  Surgeon: Toledo, Ladell POUR, MD;  Location: [facility];  Service:  Gastroenterology;  Laterality: N/A;   TOTAL ABDOMINAL HYSTERECTOMY W/ BILATERAL SALPINGOOPHORECTOMY  Age 43    OB History   No obstetric history on file.    Obstetric Comments  Age first menstrual cycle 11 Age last menstrual cycle 35- due to hysterectomy          Home Medications    Prior to Admission medications   Medication Sig Start Date End Date Taking? Authorizing Provider  amLODipine  (NORVASC ) 10 MG tablet TAKE 1 TABLET(10 MG) BY MOUTH DAILY 02/26/23  Yes Khan, Fozia M, MD  benzonatate  (TESSALON ) 100 MG capsule Take 1 capsule (100 mg total) by mouth every 8 (eight) hours. 07/04/24  Yes Kivon Aprea E, PA-C  folic acid  (FOLVITE ) 1 MG tablet TAKE 1 TABLET BY MOUTH EVERY DAY 07/30/23  Yes Melanee Annah BROCKS, MD  folic acid  (FOLVITE ) 1 MG tablet TAKE 1 TABLET BY MOUTH EVERY DAY 07/30/23  Yes Rao, Archana C, MD  ipratropium (ATROVENT ) 0.06 % nasal spray Place 2 sprays into both nostrils 4 (four) times daily. 04/14/23  Yes Brimage, Vondra, DO  lisinopril  (ZESTRIL ) 40 MG tablet Take 1 tablet (40 mg total) by mouth daily. 03/20/22  Yes Abernathy, Mardy, NP  niacin  (NIASPAN ) 500 MG CR tablet TAKE 1 TABLET(500 MG) BY MOUTH AT BEDTIME 12/27/21  Yes Abernathy, Alyssa, NP  pantoprazole  (PROTONIX ) 40 MG tablet TAKE 1 TABLET(40 MG) BY MOUTH TWICE DAILY 02/15/22  Yes Abernathy, Alyssa, NP  predniSONE (DELTASONE) 20 MG tablet Take 1 tablet (20 mg total) by mouth daily for 5 days. Take with breakfast or lunch. Avoid NSAIDs (ibuprofen, etc) while taking this medication. 07/04/24 07/09/24 Yes Krisalyn Yankowski E, PA-C  rosuvastatin  (CRESTOR ) 5 MG tablet TAKE 1 TABLET(5 MG) BY MOUTH DAILY 09/15/21  Yes Abernathy, Alyssa, NP  Cholecalciferol (VITAMIN D -3) 5000 units TABS Take 5,000 Units by mouth daily.    [provider]  Docusate Sodium (STOOL SOFTENER LAXATIVE PO) Take 1 Dose by mouth as needed.    [provider]  icosapent  Ethyl (VASCEPA ) 1 g capsule Take 2 capsules (2 g total) by mouth 2  (two) times daily. 03/20/22   Abernathy, Alyssa, NP  Multiple Vitamins-Iron (MULTIVITAMIN/IRON PO) Take 1 capsule by mouth daily.    [provider]  Saccharomyces boulardii (FLORASTOR PO) Take 1 capsule by mouth 3 (three) times a week.    [provider]  triamcinolone  cream (KENALOG ) 0.1 % Apply 1 application topically 2 (two) times daily. Patient taking differently: Apply 1 application  topically as needed. 08/19/18   Scarboro, Adam J, NP  vitamin B-12 (CYANOCOBALAMIN) 1000 MCG tablet Take 1,000 mcg by mouth daily.    [provider]    Family History Family History  Problem Relation Age of Onset   Colon cancer Father    Colon cancer Other        Paternal first cousin   Hypertension Mother    Anemia Mother     Social History Social History   Tobacco Use   Smoking status: Never   Smokeless tobacco: Never  Vaping Use   Vaping status: Never Used  Substance Use Topics   Alcohol use: Not Currently   Drug use: No     Allergies   Hydrochlorothiazide, Amoxicillin, Meloxicam, Penicillins, Simvastatin, Sulfamethoxazole-trimethoprim, and Tramadol   Review of Systems Review of Systems  Constitutional:  Negative for appetite change, chills and fever.  HENT:  Positive for congestion. Negative for ear pain, rhinorrhea, sinus pressure, sinus pain and sore throat.   Eyes:  Negative for redness and visual disturbance.  Respiratory:  Positive for cough. Negative for chest tightness, shortness of breath and wheezing.   Cardiovascular:  Negative for chest pain and palpitations.  Gastrointestinal:  Negative for abdominal pain, constipation, diarrhea, nausea and vomiting.  Genitourinary:  Negative for dysuria, frequency and urgency.  Musculoskeletal:  Negative for myalgias.  Neurological:  Negative for dizziness, weakness and headaches.  Psychiatric/Behavioral:  Negative for confusion.   All other systems reviewed and are negative.    Physical Exam Triage  Vital Signs ED Triage Vitals  Encounter Vitals Group     BP 07/04/24 0820 (!) 148/76     Girls Systolic BP Percentile --      Girls Diastolic BP Percentile --      Boys Systolic BP Percentile --      Boys Diastolic BP Percentile --      Pulse Rate 07/04/24 0820 (!) 57     Resp 07/04/24 0820 16     Temp 07/04/24 0820 98.5 F (36.9 C)     Temp Source 07/04/24 0820 Oral     SpO2 07/04/24 0820 96 %     Weight 07/04/24 0819 164 lb 14.5 oz (74.8 kg)     Height 07/04/24 0819 5' 7 (1.702 m)     Head Circumference --      Peak Flow --      Pain Score 07/04/24 0818  7     Pain Loc --      Pain Education --      Exclude from Growth Chart --    No data found.  Updated Vital Signs BP (!) 148/76 (BP Location: Left Arm)   Pulse (!) 57   Temp 98.5 F (36.9 C) (Oral)   Resp 16   Ht 5' 7 (1.702 m)   Wt 164 lb 14.5 oz (74.8 kg)   SpO2 96%   BMI 25.83 kg/m   Visual Acuity Right Eye Distance:   Left Eye Distance:   Bilateral Distance:    Right Eye Near:   Left Eye Near:    Bilateral Near:     Physical Exam Vitals reviewed.  Constitutional:      General: She is not in acute distress.    Appearance: Normal appearance. She is not ill-appearing.  HENT:     Head: Normocephalic and atraumatic.     Right Ear: Tympanic membrane, ear canal and external ear normal. No tenderness. No middle ear effusion. There is no impacted cerumen. Tympanic membrane is not perforated, erythematous, retracted or bulging.     Left Ear: Tympanic membrane, ear canal and external ear normal. No tenderness.  No middle ear effusion. There is no impacted cerumen. Tympanic membrane is not perforated, erythematous, retracted or bulging.     Nose: Nose normal. No congestion.     Mouth/Throat:     Mouth: Mucous membranes are moist.     Pharynx: Uvula midline. No oropharyngeal exudate or posterior oropharyngeal erythema.  Eyes:     General: Lids are normal. Vision grossly intact. No allergic shiner.       Right  eye: No discharge or hordeolum.        Left eye: No discharge or hordeolum.     Extraocular Movements: Extraocular movements intact.     Pupils: Pupils are equal, round, and reactive to light.  Cardiovascular:     Rate and Rhythm: Normal rate and regular rhythm.     Heart sounds: Normal heart sounds.  Pulmonary:     Effort: Pulmonary effort is normal.     Breath sounds: Normal breath sounds. No decreased breath sounds, wheezing, rhonchi or rales.  Abdominal:     Palpations: Abdomen is soft.     Tenderness: There is no abdominal tenderness. There is no guarding or rebound.  Musculoskeletal:     Comments: I was not able to reproduce her left thoracic paraspinous muscular pain.  Lymphadenopathy:     Cervical: No cervical adenopathy.     Right cervical: No superficial cervical adenopathy.    Left cervical: No superficial cervical adenopathy.  Neurological:     General: No focal deficit present.     Mental Status: She is alert and oriented to person, place, and time.  Psychiatric:        Mood and Affect: Mood normal.        Behavior: Behavior normal.        Thought Content: Thought content normal.        Judgment: Judgment normal.      UC Treatments / Results  Labs (all labs ordered are listed, but only abnormal results are displayed) Labs Reviewed - No data to display  EKG   Radiology No results found.  Procedures Procedures (including critical care time)  Medications Ordered in UC Medications - No data to display  Initial Impression / Assessment and Plan / UC Course  I have reviewed the triage vital signs  and the nursing notes.  Pertinent labs & imaging results that were available during my care of the patient were reviewed by me and considered in my medical decision making (see chart for details).     Viral bronchitis  Cough x2 weeks following viral illness. Due to duration of illness we did not collect covid/influenza testing today. CXR: no acute cardiopulm ds  . Will manage with low-dose prednisone, tessalon  perles, and home remedies (I.e. lozenge). Low concern for atypical pneumonia: she is afebrile, nontachy, and does not have history of pulm ds or smoking history. Return precautions discussed.   Final Clinical Impressions(s) / UC Diagnoses   Final diagnoses:  Acute cough  Viral bronchitis     Discharge Instructions      -Prednisone, 1 pill taken with breakfast or lunch 5 days in a row.  Try taking this earlier in the day as it can give you energy. Avoid NSAIDs like ibuprofen and alleve while taking this medication as they can increase your risk of stomach upset and even GI bleeding when in combination with a steroid. You can continue tylenol  (acetaminophen ) up to 1000mg  3x daily.  -Tessalon  (Benzonatate ) as needed for cough. Take one pill up to 3x daily (every 8 hours) -Hot tea with honey, lozenges, humidifier, etc - Follow-up if symptoms worsen instead of improve, including new fevers, worsening of cough, cough productive of dark or red sputum.     ED Prescriptions     Medication Sig Dispense Auth. Provider   benzonatate  (TESSALON ) 100 MG capsule Take 1 capsule (100 mg total) by mouth every 8 (eight) hours. 21 capsule Latish Toutant E, PA-C   predniSONE (DELTASONE) 20 MG tablet Take 1 tablet (20 mg total) by mouth daily for 5 days. Take with breakfast or lunch. Avoid NSAIDs (ibuprofen, etc) while taking this medication. 5 tablet Palma Buster E, PA-C      PDMP not reviewed this encounter.   Arlyss Leita BRAVO, PA-C 07/04/24 1007

## 2024-07-04 NOTE — ED Triage Notes (Signed)
 Pt c/o cough, body aches, chest congestion. Started about 2 weeks ago. She states she has pain in her left upper back when she coughs. Denies fever.

## 2024-07-04 NOTE — Discharge Instructions (Addendum)
-  Prednisone, 1 pill taken with breakfast or lunch 5 days in a row.  Try taking this earlier in the day as it can give you energy. Avoid NSAIDs like ibuprofen and alleve while taking this medication as they can increase your risk of stomach upset and even GI bleeding when in combination with a steroid. You can continue tylenol  (acetaminophen ) up to 1000mg  3x daily.  -Tessalon  (Benzonatate ) as needed for cough. Take one pill up to 3x daily (every 8 hours) -Hot tea with honey, lozenges, humidifier, etc - Follow-up if symptoms worsen instead of improve, including new fevers, worsening of cough, cough productive of dark or red sputum.

## 2024-09-30 ENCOUNTER — Ambulatory Visit
Admission: EM | Admit: 2024-09-30 | Discharge: 2024-09-30 | Disposition: A | Discharge status: Home / Self Care | Source: Home / Self Care

## 2024-09-30 ENCOUNTER — Encounter: Payer: Self-pay | Admitting: Emergency Medicine

## 2024-09-30 DIAGNOSIS — R6889 Other general symptoms and signs: Secondary | ICD-10-CM | POA: Diagnosis not present

## 2024-09-30 DIAGNOSIS — J069 Acute upper respiratory infection, unspecified: Secondary | ICD-10-CM | POA: Diagnosis not present

## 2024-09-30 LAB — POCT RESPIRATORY SYNCYTIAL VIRUS: RSV Antigen, POC: NEGATIVE

## 2024-09-30 LAB — POCT RAPID STREP A (OFFICE): Rapid Strep A Screen: NEGATIVE

## 2024-09-30 LAB — POCT INFLUENZA A/B
Influenza A, POC: NEGATIVE
Influenza B, POC: NEGATIVE

## 2024-09-30 MED ORDER — IPRATROPIUM BROMIDE 0.06 % NA SOLN: 2.0000 | Freq: Four times a day (QID) | NASAL | 12 refills | Status: AC

## 2024-09-30 MED ORDER — PROMETHAZINE-DM 6.25-15 MG/5ML PO SYRP: 5.0000 mL | ORAL_SOLUTION | Freq: Four times a day (QID) | ORAL | 0 refills | Status: AC | PRN

## 2024-09-30 MED ORDER — BENZONATATE 100 MG PO CAPS: 100.0000 mg | ORAL_CAPSULE | Freq: Three times a day (TID) | ORAL | 0 refills | Status: AC

## 2024-09-30 NOTE — ED Triage Notes (Signed)
 Pt c/o cough, sore throat, and fatigue since yesterday. Pt has taken OTC cold medication.

## 2024-09-30 NOTE — Discharge Instructions (Signed)
 Your testing today was negative for influenza, RSV, and strep.  We will send your strep swab for culture.  Please use over-the-counter Tylenol  according to the package instructions as needed for any fever or pain.  To soothe your throat you may gargle with warm salt water as often as she would like.  You may also use over-the-counter Chloraseptic or Sucrets lozenges.  No more than 1 lozenge every 2 hours as the menthol may give you diarrhea.  Sipping frequently on beverages can help keep the tissues and your throat moist and aid in pain relief.  You may use hot or cold beverages, whichever makes you feel better.  Use the Atrovent  nasal spray, 2 squirts in each nostril every 6 hours, as needed for runny nose and postnasal drip.  Use the Tessalon  Perles every 8 hours during the day.  Take them with a small sip of water.  They may give you some numbness to the base of your tongue or a metallic taste in your mouth, this is normal.  Use the Promethazine  DM cough syrup at bedtime for cough and congestion.  It will make you drowsy so do not take it during the day.  Return for reevaluation or see your primary care provider for any new or worsening symptoms.

## 2024-09-30 NOTE — ED Provider Notes (Signed)
 " [facility]    CSN: [id number] Arrival date & time: 09/30/24  1758      History   Chief Complaint Chief Complaint  Patient presents with   Sore Throat   Cough   Fatigue    HPI Laurie Gross is a 68 y.o. female.   HPI  68 year old female with past medical history significant for anemia, cervical radiculopathy, GERD, essential hypertension, mixed hyperlipidemia presents for evaluation of fatigue, sore throat, and cough that started yesterday.  She also endorses runny nose, nasal congestion.  Her cough is productive for white sputum.  No she history of fever, shortness breath, wheezing.  She has around others with similar symptoms but denies travel out of state or country.  Past Medical History:  Diagnosis Date   Anemia    Breast screening, unspecified    Eczema    Family history of colonic polyps    Lump or mass in breast    Neoplasm of uncertain behavior of breast    Personal history of colonic polyps    Special screening for malignant neoplasms, colon    Unspecified essential hypertension     Patient Active Problem List   Diagnosis Date Noted   Mixed hyperlipidemia 03/21/2021   Family history of colon cancer 04/08/2017   Hx of colonic polyps 04/08/2017   Cervical radiculopathy 12/15/2014   Anemia 02/14/2014   Vitamin D  deficiency, unspecified 02/14/2014   GERD (gastroesophageal reflux disease) 02/12/2014   Essential hypertension, benign 02/12/2014   Pseudocyst of pancreas 10/01/2013    Past Surgical History:  Procedure Laterality Date   ABDOMINAL HYSTERECTOMY     BREAST BIOPSY Right Age 52   BREAST BIOPSY Left 07/21/2012   Core biopsy showed atypical lobular hyperplasia and a 2 mm foci, atypical ductal hyperplasia and a 1 mm foci. There was no evidence of in situ or invasive cancer   BREAST CYST ASPIRATION  05/01/2012   inconclusive, with atypical ductal cells identified   COLONOSCOPY  2002, 2005, 2009, 2012   polyps    COLONOSCOPY WITH  PROPOFOL  N/A 12/12/2022   Procedure: COLONOSCOPY WITH PROPOFOL ;  Surgeon: Toledo, Ladell POUR, MD;  Location: [facility];  Service: Gastroenterology;  Laterality: N/A;   TOTAL ABDOMINAL HYSTERECTOMY W/ BILATERAL SALPINGOOPHORECTOMY  Age 51    OB History   No obstetric history on file.    Obstetric Comments  Age first menstrual cycle 11 Age last menstrual cycle 35- due to hysterectomy          Home Medications    Prior to Admission medications  Medication Sig Start Date End Date Taking? Authorizing Provider  promethazine -dextromethorphan (PROMETHAZINE -DM) 6.25-15 MG/5ML syrup Take 5 mLs by mouth 4 (four) times daily as needed. 09/30/24  Yes Bernardino Ditch, NP  amLODipine  (NORVASC ) 10 MG tablet TAKE 1 TABLET(10 MG) BY MOUTH DAILY 02/26/23   Khan, Fozia M, MD  benzonatate  (TESSALON ) 100 MG capsule Take 1 capsule (100 mg total) by mouth every 8 (eight) hours. 09/30/24   Bernardino Ditch, NP  Cholecalciferol (VITAMIN D -3) 5000 units TABS Take 5,000 Units by mouth daily.    [provider]  Docusate Sodium (STOOL SOFTENER LAXATIVE PO) Take 1 Dose by mouth as needed.    [provider]  folic acid  (FOLVITE ) 1 MG tablet TAKE 1 TABLET BY MOUTH EVERY DAY 07/30/23   Rao, Archana C, MD  folic acid  (FOLVITE ) 1 MG tablet TAKE 1 TABLET BY MOUTH EVERY DAY 07/30/23   Melanee Annah BROCKS, MD  icosapent   Ethyl (VASCEPA ) 1 g capsule Take 2 capsules (2 g total) by mouth 2 (two) times daily. 03/20/22   Abernathy, Alyssa, NP  ipratropium (ATROVENT ) 0.06 % nasal spray Place 2 sprays into both nostrils 4 (four) times daily. 09/30/24   Bernardino Ditch, NP  lisinopril  (ZESTRIL ) 40 MG tablet Take 1 tablet (40 mg total) by mouth daily. 03/20/22   Abernathy, Alyssa, NP  Multiple Vitamins-Iron (MULTIVITAMIN/IRON PO) Take 1 capsule by mouth daily.    [provider]  niacin  (NIASPAN ) 500 MG CR tablet TAKE 1 TABLET(500 MG) BY MOUTH AT BEDTIME 12/27/21   Abernathy, Mardy, NP  pantoprazole  (PROTONIX ) 40 MG  tablet TAKE 1 TABLET(40 MG) BY MOUTH TWICE DAILY 02/15/22   Abernathy, Mardy, NP  rosuvastatin  (CRESTOR ) 5 MG tablet TAKE 1 TABLET(5 MG) BY MOUTH DAILY 09/15/21   Liana Mardy, NP  Saccharomyces boulardii (FLORASTOR PO) Take 1 capsule by mouth 3 (three) times a week.    [provider]  triamcinolone  cream (KENALOG ) 0.1 % Apply 1 application topically 2 (two) times daily. Patient taking differently: Apply 1 application  topically as needed. 08/19/18   Scarboro, Adam J, NP  vitamin B-12 (CYANOCOBALAMIN ) 1000 MCG tablet Take 1,000 mcg by mouth daily.    [provider]    Family History Family History  Problem Relation Age of Onset   Colon cancer Father    Colon cancer Other        Paternal first cousin   Hypertension Mother    Anemia Mother     Social History Social History[1]   Allergies   Hydrochlorothiazide, Penicillins, Amoxicillin, Meloxicam, Simvastatin, Sulfamethoxazole-trimethoprim, and Tramadol   Review of Systems Review of Systems  Constitutional:  Positive for fatigue. Negative for fever.  HENT:  Positive for congestion, rhinorrhea, sore throat and voice change. Negative for ear pain.   Respiratory:  Positive for cough. Negative for shortness of breath and wheezing.      Physical Exam Triage Vital Signs ED Triage Vitals  Encounter Vitals Group     BP      Girls Systolic BP Percentile      Girls Diastolic BP Percentile      Boys Systolic BP Percentile      Boys Diastolic BP Percentile      Pulse      Resp      Temp      Temp src      SpO2      Weight      Height      Head Circumference      Peak Flow      Pain Score      Pain Loc      Pain Education      Exclude from Growth Chart    No data found.  Updated Vital Signs BP (!) 159/70 (BP Location: Right Arm)   Pulse 70   Temp 98.6 F (37 C) (Oral)   Resp 16   Wt 203 lb (92.1 kg)   SpO2 97%   BMI 31.79 kg/m   Visual Acuity Right Eye Distance:   Left Eye Distance:    Bilateral Distance:    Right Eye Near:   Left Eye Near:    Bilateral Near:     Physical Exam Vitals and nursing note reviewed.  Constitutional:      Appearance: Normal appearance. She is not ill-appearing.  HENT:     Head: Normocephalic and atraumatic.     Right Ear: Tympanic membrane, ear canal and  external ear normal. There is no impacted cerumen.     Left Ear: Tympanic membrane, ear canal and external ear normal. There is no impacted cerumen.     Nose: Congestion and rhinorrhea present.     Comments: Nasal mucosa is edematous and erythematous with clear discharge in both nares.    Mouth/Throat:     Mouth: Mucous membranes are moist.     Pharynx: Oropharynx is clear. Posterior oropharyngeal erythema present. No oropharyngeal exudate.     Comments: Tonsillar pillars unremarkable.  Posterior pharynx demonstrates erythema and injection with clear postnasal drip. Cardiovascular:     Rate and Rhythm: Normal rate and regular rhythm.     Pulses: Normal pulses.     Heart sounds: Normal heart sounds. No murmur heard.    No friction rub. No gallop.  Pulmonary:     Effort: Pulmonary effort is normal.     Breath sounds: Normal breath sounds. No wheezing, rhonchi or rales.  Musculoskeletal:     Cervical back: Normal range of motion and neck supple. No tenderness.  Lymphadenopathy:     Cervical: No cervical adenopathy.  Skin:    General: Skin is warm and dry.     Capillary Refill: Capillary refill takes less than 2 seconds.     Findings: No rash.  Neurological:     General: No focal deficit present.     Mental Status: She is alert and oriented to person, place, and time.      UC Treatments / Results  Labs (all labs ordered are listed, but only abnormal results are displayed) Labs Reviewed  POCT RAPID STREP A (OFFICE) - Normal  POCT INFLUENZA A/B - Normal  POCT RESPIRATORY SYNCYTIAL VIRUS - Normal  CULTURE, GROUP A STREP [facility])    EKG   Radiology No results  found.  Procedures Procedures (including critical care time)  Medications Ordered in UC Medications - No data to display  Initial Impression / Assessment and Plan / UC Course  I have reviewed the triage vital signs and the nursing notes.  Pertinent labs & imaging results that were available during my care of the patient were reviewed by me and considered in my medical decision making (see chart for details).   Patient is a pleasant 68 year old female presenting for evaluation of acute onset respiratory symptoms as outlined in HPI above.  One of her most prominent symptoms is a sore throat and she is experiencing some hoarseness.  She reports that she has been in others with similar symptoms but denies any recent travel of state or country.  Differential diagnosis include influenza, RSV, strep pharyngitis, viral illness.  I will order a rapid strep as well as a influenza and RSV antigen test.  Rapid strep is negative.  I will send swab for culture.  Influenza antigen test is negative.  RSV antigen test is negative.  I will discharge patient home with a diagnosis of viral URI with cough.  Prescribe Atrovent  nasal spray for nasal congestion and Tessalon  Perles and Promethazine  DM cough syrup for cough and congestion.  She may soothe her throat with salt water gargles and over-the-counter Chloraseptic or Sucrets lozenges.  Return precautions reviewed.   Final Clinical Impressions(s) / UC Diagnoses   Final diagnoses:  Influenza-like symptoms  Viral URI with cough     Discharge Instructions      Your testing today was negative for influenza, RSV, and strep.  We will send your strep swab for culture.  Please use over-the-counter Tylenol   according to the package instructions as needed for any fever or pain.  To soothe your throat you may gargle with warm salt water as often as she would like.  You may also use over-the-counter Chloraseptic or Sucrets lozenges.  No more than 1 lozenge  every 2 hours as the menthol may give you diarrhea.  Sipping frequently on beverages can help keep the tissues and your throat moist and aid in pain relief.  You may use hot or cold beverages, whichever makes you feel better.  Use the Atrovent  nasal spray, 2 squirts in each nostril every 6 hours, as needed for runny nose and postnasal drip.  Use the Tessalon  Perles every 8 hours during the day.  Take them with a small sip of water.  They may give you some numbness to the base of your tongue or a metallic taste in your mouth, this is normal.  Use the Promethazine  DM cough syrup at bedtime for cough and congestion.  It will make you drowsy so do not take it during the day.  Return for reevaluation or see your primary care provider for any new or worsening symptoms.      ED Prescriptions     Medication Sig Dispense Auth. Provider   benzonatate  (TESSALON ) 100 MG capsule Take 1 capsule (100 mg total) by mouth every 8 (eight) hours. 21 capsule Bernardino Ditch, NP   ipratropium (ATROVENT ) 0.06 % nasal spray Place 2 sprays into both nostrils 4 (four) times daily. 15 mL Bernardino Ditch, NP   promethazine -dextromethorphan (PROMETHAZINE -DM) 6.25-15 MG/5ML syrup Take 5 mLs by mouth 4 (four) times daily as needed. 118 mL Bernardino Ditch, NP      PDMP not reviewed this encounter.     [1]  Social History Tobacco Use   Smoking status: Never   Smokeless tobacco: Never  Vaping Use   Vaping status: Never Used  Substance Use Topics   Alcohol use: Not Currently   Drug use: No     Bernardino Ditch, NP 09/30/24 1852  "

## 2024-10-03 LAB — CULTURE, GROUP A STREP (THRC)

## 2024-10-05 ENCOUNTER — Ambulatory Visit (HOSPITAL_COMMUNITY): Payer: Self-pay
